# Patient Record
Sex: Male | Born: 1960 | Race: White | Hispanic: No | Marital: Married | Smoking: Never smoker
Health system: Southern US, Community
[De-identification: ages and names within clinical notes are randomized; demographics above are authoritative.]

## PROBLEM LIST (undated history)

## (undated) DIAGNOSIS — R972 Elevated prostate specific antigen [PSA]: Secondary | ICD-10-CM

## (undated) DIAGNOSIS — E0590 Thyrotoxicosis, unspecified without thyrotoxic crisis or storm: Secondary | ICD-10-CM

## (undated) DIAGNOSIS — E039 Hypothyroidism, unspecified: Secondary | ICD-10-CM

## (undated) HISTORY — PX: TONSILECTOMY, ADENOIDECTOMY, BILATERAL MYRINGOTOMY AND TUBES: SHX2538

## (undated) HISTORY — DX: Hypothyroidism, unspecified: E03.9

## (undated) HISTORY — DX: Thyrotoxicosis, unspecified without thyrotoxic crisis or storm: E05.90

## (undated) HISTORY — PX: OTHER SURGICAL HISTORY: SHX169

## (undated) HISTORY — DX: Elevated prostate specific antigen (PSA): R97.20

---

## 2014-10-29 ENCOUNTER — Other Ambulatory Visit: Payer: Self-pay | Admitting: Physician Assistant

## 2014-10-29 DIAGNOSIS — E0590 Thyrotoxicosis, unspecified without thyrotoxic crisis or storm: Secondary | ICD-10-CM

## 2014-11-05 ENCOUNTER — Ambulatory Visit
Admission: RE | Admit: 2014-11-05 | Discharge: 2014-11-05 | Disposition: A | Discharge status: Home / Self Care | Payer: BLUE CROSS/BLUE SHIELD | Source: Ambulatory Visit | Attending: Physician Assistant | Admitting: Physician Assistant

## 2014-11-05 DIAGNOSIS — E0590 Thyrotoxicosis, unspecified without thyrotoxic crisis or storm: Secondary | ICD-10-CM | POA: Insufficient documentation

## 2014-12-22 ENCOUNTER — Ambulatory Visit: Payer: BLUE CROSS/BLUE SHIELD | Admitting: *Deleted

## 2014-12-22 ENCOUNTER — Ambulatory Visit
Admission: RE | Admit: 2014-12-22 | Discharge: 2014-12-22 | Disposition: A | Discharge status: Home / Self Care | Payer: BLUE CROSS/BLUE SHIELD | Source: Ambulatory Visit | Attending: Unknown Physician Specialty | Admitting: Unknown Physician Specialty

## 2014-12-22 ENCOUNTER — Encounter: Payer: Self-pay | Admitting: Anesthesiology

## 2014-12-22 ENCOUNTER — Encounter
Admission: RE | Disposition: A | Discharge status: Home / Self Care | Payer: Self-pay | Source: Ambulatory Visit | Attending: Unknown Physician Specialty

## 2014-12-22 DIAGNOSIS — E0590 Thyrotoxicosis, unspecified without thyrotoxic crisis or storm: Secondary | ICD-10-CM | POA: Diagnosis not present

## 2014-12-22 DIAGNOSIS — Z1211 Encounter for screening for malignant neoplasm of colon: Secondary | ICD-10-CM | POA: Diagnosis not present

## 2014-12-22 DIAGNOSIS — K635 Polyp of colon: Secondary | ICD-10-CM | POA: Insufficient documentation

## 2014-12-22 DIAGNOSIS — K640 First degree hemorrhoids: Secondary | ICD-10-CM | POA: Insufficient documentation

## 2014-12-22 HISTORY — PX: COLONOSCOPY WITH PROPOFOL: SHX5780

## 2014-12-22 SURGERY — COLONOSCOPY WITH PROPOFOL
Anesthesia: General

## 2014-12-22 MED ORDER — LACTATED RINGERS IV SOLN
INTRAVENOUS | Status: DC | PRN
  Administered 2014-12-22: 13:00:00 via INTRAVENOUS

## 2014-12-22 MED ORDER — PHENYLEPHRINE HCL 10 MG/ML IJ SOLN
INTRAMUSCULAR | Status: DC | PRN
  Administered 2014-12-22: 100 ug via INTRAVENOUS

## 2014-12-22 MED ORDER — SODIUM CHLORIDE 0.9 % IV SOLN
INTRAVENOUS | Status: DC
  Administered 2014-12-22: 13:00:00 via INTRAVENOUS

## 2014-12-22 MED ORDER — PROPOFOL 500 MG/50ML IV EMUL
INTRAVENOUS | Status: DC | PRN
  Administered 2014-12-22: 100 ug/kg/min via INTRAVENOUS

## 2014-12-22 NOTE — Op Note (Signed)
[facility] Gastroenterology Patient Name: Patrick Benson Procedure Date: 12/22/2014 1:21 PM MRN: [id number] Account #: [id number] Date of Birth: Apr 10, 1960 Admit Type: Outpatient Age: 54 Room: [facility] ENDO ROOM 1 Gender: Male Note Status: Finalized Procedure:         Colonoscopy Indications:       Screening for colorectal malignant neoplasm Providers:         Scot Jun, MD Referring MD:      Jabier Mutton, MD (Referring MD) Medicines:         Propofol per Anesthesia Complications:     No immediate complications. Procedure:         Pre-Anesthesia Assessment:                    - After reviewing the risks and benefits, the patient was                     deemed in satisfactory condition to undergo the procedure.                    After obtaining informed consent, the colonoscope was                     passed under direct vision. Throughout the procedure, the                     patient's blood pressure, pulse, and oxygen saturations                     were monitored continuously. The Colonoscope was                     introduced through the anus and advanced to the the cecum,                     identified by appendiceal orifice and ileocecal valve. The                     colonoscopy was performed without difficulty. The patient                     tolerated the procedure well. The quality of the bowel                     preparation was good. Findings:      A small polyp was found in the proximal ascending colon. The polyp was       sessile. The polyp was removed with a hot snare. Resection and retrieval       were complete. One clip applied to the site.      Internal hemorrhoids were found during endoscopy. The hemorrhoids were       small and Grade I (internal hemorrhoids that do not prolapse).      The exam was otherwise without abnormality. Impression:        - One small polyp in the proximal ascending colon.                     Resected and  retrieved.                    - Internal hemorrhoids.                    - The examination was otherwise normal. Recommendation:    -  Await pathology results. Scot Junobert T Elliott, MD 12/22/2014 1:47:18 PM This report has been signed electronically. Number of Addenda: 0 Note Initiated On: 12/22/2014 1:21 PM Scope Withdrawal Time: 0 hours 9 minutes 45 seconds  Total Procedure Duration: 0 hours 17 minutes 27 seconds       [facility]

## 2014-12-22 NOTE — Anesthesia Preprocedure Evaluation (Signed)
Anesthesia Evaluation  Patient identified by MRN, date of birth, ID band Patient awake    Reviewed: Allergy & Precautions, H&P , NPO status , Patient's Chart, lab work & pertinent test results, reviewed documented beta blocker date and time   History of Anesthesia Complications Negative for: history of anesthetic complications  Airway Mallampati: III  TM Distance: >3 FB Neck ROM: full    Dental no notable dental hx. (+) Poor Dentition   Pulmonary neg pulmonary ROS,    Pulmonary exam normal breath sounds clear to auscultation       Cardiovascular Exercise Tolerance: Good negative cardio ROS Normal cardiovascular exam Rhythm:regular Rate:Normal     Neuro/Psych negative neurological ROS  negative psych ROS   GI/Hepatic negative GI ROS, Neg liver ROS,   Endo/Other  neg diabetesHyperthyroidism   Renal/GU negative Renal ROS  negative genitourinary   Musculoskeletal   Abdominal   Peds  Hematology negative hematology ROS (+)   Anesthesia Other Findings History reviewed. No pertinent past medical history.   Reproductive/Obstetrics negative OB ROS                             Anesthesia Physical Anesthesia Plan  ASA: II  Anesthesia Plan: General   Post-op Pain Management:    Induction:   Airway Management Planned:   Additional Equipment:   Intra-op Plan:   Post-operative Plan:   Informed Consent: I have reviewed the patients History and Physical, chart, labs and discussed the procedure including the risks, benefits and alternatives for the proposed anesthesia with the patient or authorized representative who has indicated his/her understanding and acceptance.   Dental Advisory Given  Plan Discussed with: Anesthesiologist, CRNA and Surgeon  Anesthesia Plan Comments:         Anesthesia Quick Evaluation

## 2014-12-22 NOTE — H&P (Signed)
   Primary Care Physician:  Fulton ReekSTRICKLAND, JAMES D, MD Primary Gastroenterologist:  Dr. Mechele CollinElliott  Pre-Procedure History & Physical: HPI:  Patrick SimsDavid Benson is a 54 y.o. male is here for an colonoscopy.   No past medical history on file.  No past surgical history on file.  Prior to Admission medications   Not on File    Allergies as of 11/25/2014  . (Not on File)    No family history on file.  Social History   Social History  . Marital Status: Married    Spouse Name: N/A  . Number of Children: N/A  . Years of Education: N/A   Occupational History  . Not on file.   Social History Main Topics  . Smoking status: Not on file  . Smokeless tobacco: Not on file  . Alcohol Use: Not on file  . Drug Use: Not on file  . Sexual Activity: Not on file   Other Topics Concern  . Not on file   Social History Narrative  . No narrative on file    Review of Systems: See HPI, otherwise negative ROS  Physical Exam: BP 125/74 mmHg  Pulse 74  Temp(Src) 97.2 F (36.2 C) (Tympanic)  Resp 18  Ht 5\' 4"  (1.626 m)  Wt 63.504 kg (140 lb)  BMI 24.02 kg/m2 General:   Alert,  pleasant and cooperative in NAD Head:  Normocephalic and atraumatic. Neck:  Supple; no masses or thyromegaly. Lungs:  Clear throughout to auscultation.    Heart:  Regular rate and rhythm. Abdomen:  Soft, nontender and nondistended. Normal bowel sounds, without guarding, and without rebound.   Neurologic:  Alert and  oriented x4;  grossly normal neurologically.  Impression/Plan: Patrick Benson is here for an colonoscopy to be performed for screening colon  Risks, benefits, limitations, and alternatives regarding  colonoscopy have been reviewed with the patient.  Questions have been answered.  All parties agreeable.   Lynnae PrudeELLIOTT, ROBERT, MD  12/22/2014, 1:11 PM

## 2014-12-22 NOTE — Transfer of Care (Signed)
Immediate Anesthesia Transfer of Care Note  Patient: Patrick SimsDavid Benson  Procedure(s) Performed: Procedure(s): COLONOSCOPY WITH PROPOFOL (N/A)  Patient Location: Endoscopy Unit  Anesthesia Type:General  Level of Consciousness: awake  Airway & Oxygen Therapy: Patient Spontanous Breathing and Patient connected to nasal cannula oxygen  Post-op Assessment: Report given to RN and Post -op Vital signs reviewed and stable  Post vital signs: Reviewed and stable  Last Vitals:  Filed Vitals:   12/22/14 1410  BP: 103/70  Pulse: 67  Temp:   Resp: 19    Complications: No apparent anesthesia complications

## 2014-12-23 NOTE — Anesthesia Postprocedure Evaluation (Signed)
  Anesthesia Post-op Note  Patient: Patrick SimsDavid Doenges  Procedure(s) Performed: Procedure(s): COLONOSCOPY WITH PROPOFOL (N/A)  Anesthesia type:General  Patient location: PACU  Post pain: Pain level controlled  Post assessment: Post-op Vital signs reviewed, Patient's Cardiovascular Status Stable, Respiratory Function Stable, Patent Airway and No signs of Nausea or vomiting  Post vital signs: Reviewed and stable  Last Vitals:  Filed Vitals:   12/22/14 1420  BP: 101/75  Pulse: 60  Temp:   Resp: 21    Level of consciousness: awake, alert  and patient cooperative  Complications: No apparent anesthesia complications

## 2014-12-25 ENCOUNTER — Encounter: Payer: Self-pay | Admitting: Unknown Physician Specialty

## 2014-12-25 LAB — SURGICAL PATHOLOGY

## 2015-09-21 ENCOUNTER — Ambulatory Visit (INDEPENDENT_AMBULATORY_CARE_PROVIDER_SITE_OTHER): Payer: BLUE CROSS/BLUE SHIELD | Admitting: Urology

## 2015-09-21 ENCOUNTER — Encounter: Payer: Self-pay | Admitting: Urology

## 2015-09-21 DIAGNOSIS — R972 Elevated prostate specific antigen [PSA]: Secondary | ICD-10-CM

## 2015-09-21 MED ORDER — SULFAMETHOXAZOLE-TRIMETHOPRIM 800-160 MG PO TABS: 1.0000 | ORAL_TABLET | Freq: Two times a day (BID) | ORAL | 0 refills | Status: AC

## 2015-09-21 NOTE — Progress Notes (Signed)
   09/21/2015 9:33 AM   Patrick Benson 11/08/1960 [id number]  Referring provider: Jabier Mutton, MD [facility] [address] [place], [place] [address]  No chief complaint on file.   HPI:  1 - Elevated / Rising PSA - No FHX prostate cancer. Rising PSA astutely noted by PCP. 2016 PSA 1.6 05/2015 PSA 3.9 --> recheck 08/2015 PSA 7.2 with 7% free (>50% chance cancer) / DRE 25 gm smooth.  PMH sig for TNA as child. NO CV disease. NO blood thinner. He is Sport and exercise psychologist.   Today "Patrick Benson" is seen as new patient for above. He is referred by Ethlyn Gallery MD with [facility] occupational health.   PMH: No past medical history on file.  Surgical History: Past Surgical History:  Procedure Laterality Date  . COLONOSCOPY WITH PROPOFOL N/A 12/22/2014   Procedure: COLONOSCOPY WITH PROPOFOL;  Surgeon: Scot Jun, MD;  Location: [facility] ENDOSCOPY;  Service: Endoscopy;  Laterality: N/A;    Home Medications:    Medication List    as of 09/21/2015  9:33 AM   You have not been prescribed any medications.     Allergies: No Known Allergies  Family History: No family history on file.  Social History:  has no tobacco, alcohol, and drug history on file.  ROS:     Gastrointestinal (upper)  : Negative for upper GI symptoms  Gastrointestinal (lower) : Negative for lower GI symptoms  Constitutional : Negative for symptoms  Skin: Negative for skin symptoms  Eyes: Negative for eye symptoms  Ear/Nose/Throat : Negative for Ear/Nose/Throat symptoms  Hematologic/Lymphatic: Negative for Hematologic/Lymphatic symptoms  Cardiovascular : Negative for cardiovascular symptoms  Respiratory : Negative for respiratory symptoms  Endocrine: Negative for endocrine symptoms  Musculoskeletal: Negative for musculoskeletal symptoms  Neurological: Negative for neurological symptoms  Psychologic: Negative for psychiatric symptoms     Physical  Exam: There were no vitals taken for this visit.  Constitutional:  Alert and oriented, No acute distress. HEENT: [place] AT, moist mucus membranes.  Trachea midline, no masses. Cardiovascular: No clubbing, cyanosis, or edema. Respiratory: Normal respiratory effort, no increased work of breathing. GI: Abdomen is soft, nontender, nondistended, no abdominal masses. Non-obese.  GU: No CVA tenderness. DRE 25gm smooth, mobile.  Skin: No rashes, bruises or suspicious lesions. Lymph: No cervical or inguinal adenopathy. Neurologic: Grossly intact, no focal deficits, moving all 4 extremities. Psychiatric: Normal mood and affect.  Laboratory Data: No results found for: WBC, HGB, HCT, MCV, PLT  No results found for: CREATININE  No results found for: PSA  No results found for: TESTOSTERONE  No results found for: HGBA1C  Urinalysis No results found for: COLORURINE, APPEARANCEUR, LABSPEC, PHURINE, GLUCOSEU, HGBUR, BILIRUBINUR, KETONESUR, PROTEINUR, UROBILINOGEN, NITRITE, LEUKOCYTESUR   Assessment & Plan:    1. Elevated PSA   - significant rise and elevation in younger man with minimal comorbidity. Strongly rec biopsy. Risks, benefits, alternatives, peri-BX course discussed.     No Follow-up on file.  Sebastian Ache, MD  [facility] [address], [address] [place], [place] [address] [phone]

## 2015-11-16 ENCOUNTER — Encounter: Payer: Self-pay | Admitting: Urology

## 2015-11-16 ENCOUNTER — Ambulatory Visit (INDEPENDENT_AMBULATORY_CARE_PROVIDER_SITE_OTHER): Payer: BLUE CROSS/BLUE SHIELD | Admitting: Urology

## 2015-11-16 ENCOUNTER — Other Ambulatory Visit: Payer: Self-pay | Admitting: Urology

## 2015-11-16 VITALS — BP 130/83 | HR 60 | Ht 63.0 in | Wt 152.8 lb

## 2015-11-16 DIAGNOSIS — R972 Elevated prostate specific antigen [PSA]: Secondary | ICD-10-CM

## 2015-11-16 MED ORDER — GENTAMICIN SULFATE 40 MG/ML IJ SOLN
80.0000 mg | Freq: Once | INTRAMUSCULAR | Status: AC
  Administered 2015-11-16: 80 mg via INTRAMUSCULAR

## 2015-11-16 MED ORDER — LIDOCAINE HCL 2 % EX GEL
1.0000 "application " | Freq: Once | CUTANEOUS | Status: AC
  Administered 2015-11-16: 1 via URETHRAL

## 2015-11-16 NOTE — Progress Notes (Signed)
09/21/2015 9:33 AM   Patrick Benson 01/21/1961 [id number]  Referring provider: Jabier MuttonJames Strickland, MD [facility] [address] [place], [place] [address]  No chief complaint on file.   HPI:  1 - Elevated / Rising PSA - No FHX prostate cancer. Rising PSA astutely noted by PCP. 2016 PSA 1.6 05/2015 PSA 3.9 --> recheck 08/2015 PSA 7.2 with 7% free (>50% chance cancer) / DRE 25 gm smooth. 10/2015 TRUS BX 50.4 mL / no median lobe.   PMH sig for TNA as child. NO CV disease. NO blood thinner. He is Sport and exercise psychologistcivil engineer. His PCP is Ethlyn GalleryJoe Rabinowitz MD with [facility] [place] [facility].  Today "[name]" is seen to proceed with prostate biopsy. He took ABX and enema as RX'd.   PMH: No past medical history on file.  Surgical History: Past Surgical History:  Procedure Laterality Date  . COLONOSCOPY WITH PROPOFOL N/A 12/22/2014   Procedure: COLONOSCOPY WITH PROPOFOL;  Surgeon: Scot Junobert T Elliott, MD;  [facility];  Service: Endoscopy;  Laterality: N/A;    Home Medications:    Medication List    as of 09/21/2015  9:33 AM   You have not been prescribed any medications.     Allergies: No Known Allergies  Family History: No family history on file.  Social History:  has no tobacco, alcohol, and drug history on file.  ROS:     Gastrointestinal (upper)  : Negative for upper GI symptoms  Gastrointestinal (lower) : Negative for lower GI symptoms  Constitutional : Negative for symptoms  Skin: Negative for skin symptoms  Eyes: Negative for eye symptoms  Ear/Nose/Throat : Negative for Ear/Nose/Throat symptoms  Hematologic/Lymphatic: Negative for Hematologic/Lymphatic symptoms  Cardiovascular : Negative for cardiovascular symptoms  Respiratory : Negative for respiratory symptoms  Endocrine: Negative for endocrine symptoms  Musculoskeletal: Negative for musculoskeletal symptoms  Neurological: Negative for neurological  symptoms  Psychologic: Negative for psychiatric symptoms     Physical Exam: There were no vitals taken for this visit.  Constitutional:  Alert and oriented, No acute distress. HEENT: [place] AT, moist mucus membranes.  Trachea midline, no masses. Cardiovascular: No clubbing, cyanosis, or edema. Respiratory: Normal respiratory effort, no increased work of breathing. GI: Abdomen is soft, nontender, nondistended, no abdominal masses. Non-obese.  GU: No CVA tenderness. DRE 25gm smooth, mobile.  Skin: No rashes, bruises or suspicious lesions. Lymph: No cervical or inguinal adenopathy. Neurologic: Grossly intact, no focal deficits, moving all 4 extremities. Psychiatric: Normal mood and affect.  Laboratory Data: No results found for: WBC, HGB, HCT, MCV, PLT  No results found for: CREATININE  No results found for: PSA  No results found for: TESTOSTERONE  No results found for: HGBA1C  Urinalysis No results found for: COLORURINE, APPEARANCEUR, LABSPEC, PHURINE, GLUCOSEU, HGBUR, BILIRUBINUR, KETONESUR, PROTEINUR, UROBILINOGEN, NITRITE, LEUKOCYTESUR  Prostate Biopsy Procedure   Informed consent was obtained after discussing risks/benefits of the procedure.  A time out was performed to ensure correct patient identity.  Pre-Procedure: - Gentamicin given prophylactically - Levaquin 500 mg administered PO -Transrectal Ultrasound performed revealing a 50.4 gm prostate -No significant hypoechoic or median lobe noted  Procedure: - Prostate block performed using 10 cc 1% lidocaine and biopsies taken from sextant areas, a total of 12 under ultrasound guidance.  Post-Procedure: - Patient tolerated the procedure well - He was counseled to seek immediate medical attention if experiences any severe pain, significant bleeding, or fevers - Return in one week to discuss biopsy results    Assessment &  Plan:    1. Elevated PSA   - RTC 3 weeks to discuss results. Strongly warned to contact MD for  new fevers or urinary retention post-BX.      No Follow-up on file.  Sebastian Ache, MD  [facility] [address] [place], [place] [address] [phone]

## 2015-11-19 ENCOUNTER — Other Ambulatory Visit: Payer: Self-pay | Admitting: Urology

## 2015-11-19 LAB — PATHOLOGY REPORT

## 2015-11-24 ENCOUNTER — Encounter: Payer: Self-pay | Admitting: Urology

## 2015-11-24 ENCOUNTER — Ambulatory Visit (INDEPENDENT_AMBULATORY_CARE_PROVIDER_SITE_OTHER): Payer: BLUE CROSS/BLUE SHIELD | Admitting: Urology

## 2015-11-24 VITALS — BP 115/71 | HR 56 | Ht 63.0 in | Wt 151.1 lb

## 2015-11-24 DIAGNOSIS — R972 Elevated prostate specific antigen [PSA]: Secondary | ICD-10-CM | POA: Diagnosis not present

## 2015-11-24 NOTE — Progress Notes (Signed)
   09/21/2015 9:33 AM   Patrick Benson 02/10/1961 [id number]  Referring provider: Jabier MuttonJames Strickland, MD [facility]  [address] [place], [place] [address]  No chief complaint on file.   HPI:  1 - Elevated / Rising PSA - No FHX prostate cancer. Rising PSA astutely noted by PCP. 2016 PSA 1.6 05/2015 PSA 3.9 --> recheck 08/2015 PSA 7.2 with 7% free (>50% chance cancer) / DRE 25 gm smooth. 10/2015 NEGATIVE TRUS BX 50.4 mL / no median lobe.   PMH sig for TNA as child. NO CV disease. NO blood thinner. 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  Today "Patrick Benson" is seen in f/u above. He is back to baseline.   PMH: No past medical history on file.  Surgical History: Past Surgical History:  Procedure Laterality Date  . COLONOSCOPY WITH PROPOFOL N/A 12/22/2014   Procedure: COLONOSCOPY WITH PROPOFOL;  Surgeon: Patrick Junobert T Elliott, MD;  [facility];  Service: Endoscopy;  Laterality: N/A;    Home Medications:    Medication List    as of 09/21/2015  9:33 AM   You have not been prescribed any medications.     Allergies: No Known Allergies  Family History: No family history on file.  Social History:  has no tobacco, alcohol, and drug history on file.  ROS:     Gastrointestinal (upper)  : Negative for upper GI symptoms  Gastrointestinal (lower) : Negative for lower GI symptoms  Constitutional : Negative for symptoms  Skin: Negative for skin symptoms  Eyes: Negative for eye symptoms  Ear/Nose/Throat : Negative for Ear/Nose/Throat symptoms  Hematologic/Lymphatic: Negative for Hematologic/Lymphatic symptoms  Cardiovascular : Negative for cardiovascular symptoms  Respiratory : Negative for respiratory symptoms  Endocrine: Negative for endocrine symptoms  Musculoskeletal: Negative for musculoskeletal symptoms  Neurological: Negative for neurological  symptoms  Psychologic: Negative for psychiatric symptoms     Physical Exam: There were no vitals taken for this visit.  Constitutional:  Alert and oriented, No acute distress. HEENT: [place] AT, moist mucus membranes.  Trachea midline, no masses. Cardiovascular: No clubbing, cyanosis, or edema. Respiratory: Normal respiratory effort, no increased work of breathing. GI: Abdomen is soft, nontender, nondistended, no abdominal masses. Non-obese.  GU: No CVA tenderness. DRE 25gm smooth, mobile.  Skin: No rashes, bruises or suspicious lesions. Lymph: No cervical or inguinal adenopathy. Neurologic: Grossly intact, no focal deficits, moving all 4 extremities. Psychiatric: Normal mood and affect.  Laboratory Data: No results found for: WBC, HGB, HCT, MCV, PLT  No results found for: CREATININE  No results found for: PSA  No results found for: TESTOSTERONE  No results found for: HGBA1C  Urinalysis No results found for: COLORURINE, APPEARANCEUR, LABSPEC, PHURINE, GLUCOSEU, HGBUR, BILIRUBINUR, KETONESUR, PROTEINUR, UROBILINOGEN, NITRITE, LEUKOCYTESUR  Assessment & Plan:    1. Elevated PSA   - histologically proven BPH. This is great news. Rec annual surveillance with us x few years as PSA is quite high at young age. Consider repeat BX with MRI prior (to rule out anterior lesions) if PSA trend continues up.   2 - RTC 1 year with PSA,      No Follow-up on file.  Patrick Benson, Patrick Davitt, MD  [facility] [address], [address] [place], [place] [address] [phone]

## 2016-11-23 ENCOUNTER — Other Ambulatory Visit: Payer: BLUE CROSS/BLUE SHIELD

## 2016-11-28 ENCOUNTER — Ambulatory Visit: Payer: BLUE CROSS/BLUE SHIELD

## 2017-09-04 IMAGING — US US SOFT TISSUE HEAD/NECK
1 series · 14 of 25 positions shown · non-contrast
Comparison: None.

CLINICAL DATA: Hyperthyroidism

EXAM:
THYROID ULTRASOUND
TECHNIQUE: Ultrasound examination of the thyroid gland and adjacent soft
tissues was performed.

[Series 1: us soft tissue head/neck · 0.09mm/px · 14 of 57 slices shown]
[im 1/57]
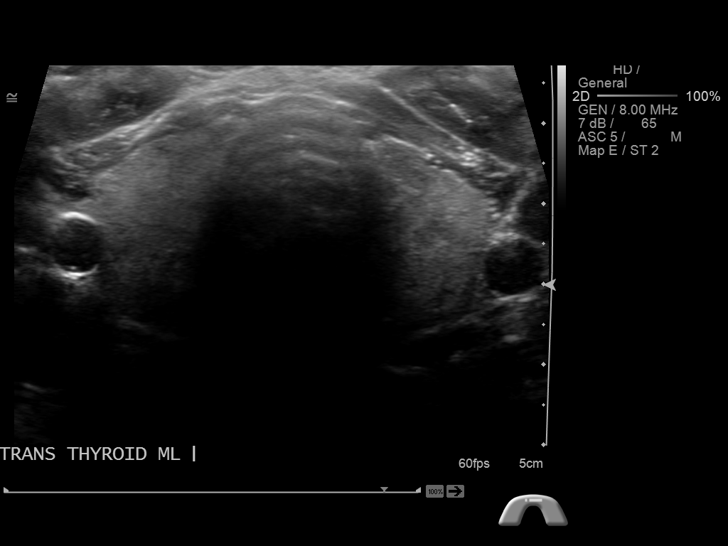
[im 5/57]
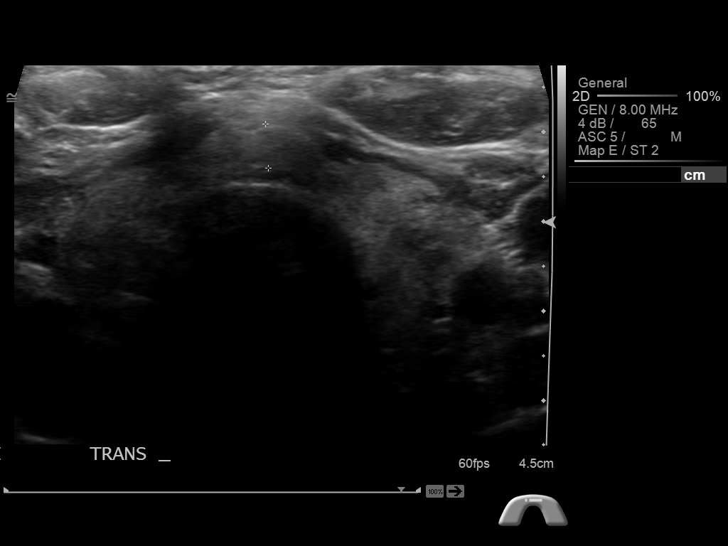
[im 10/57]
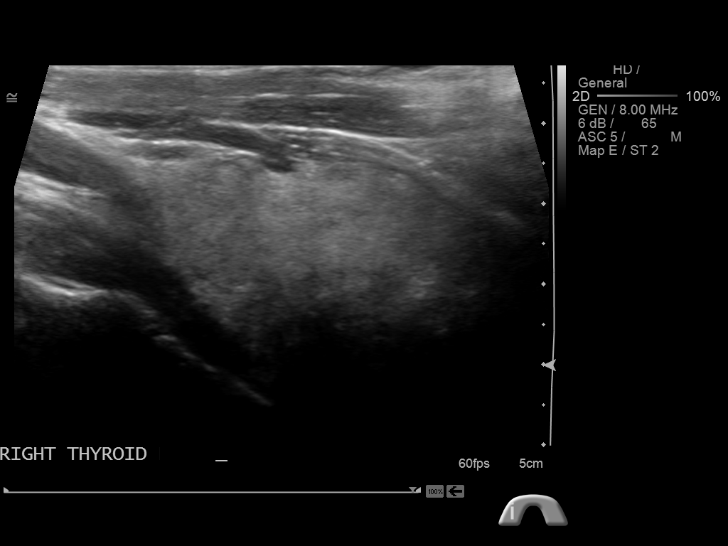
[im 15/57]
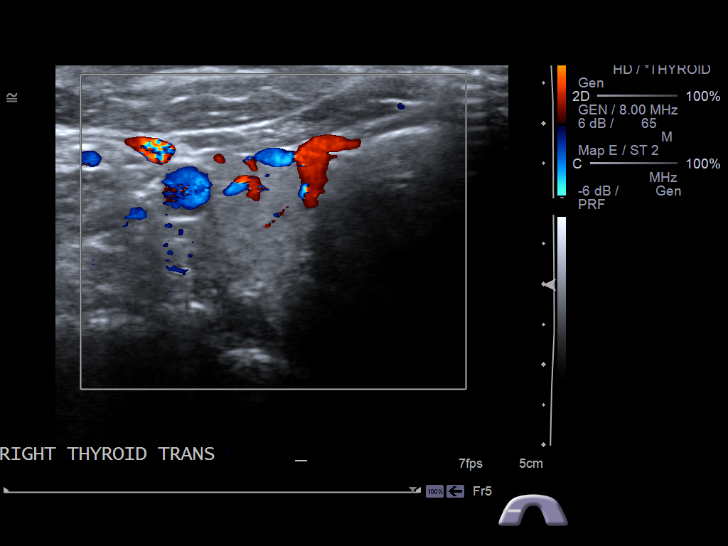
[im 19/57]
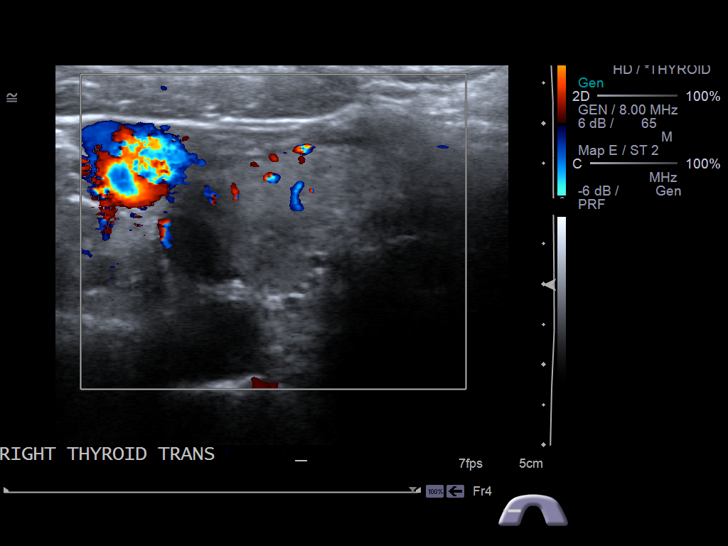
[im 22/57]
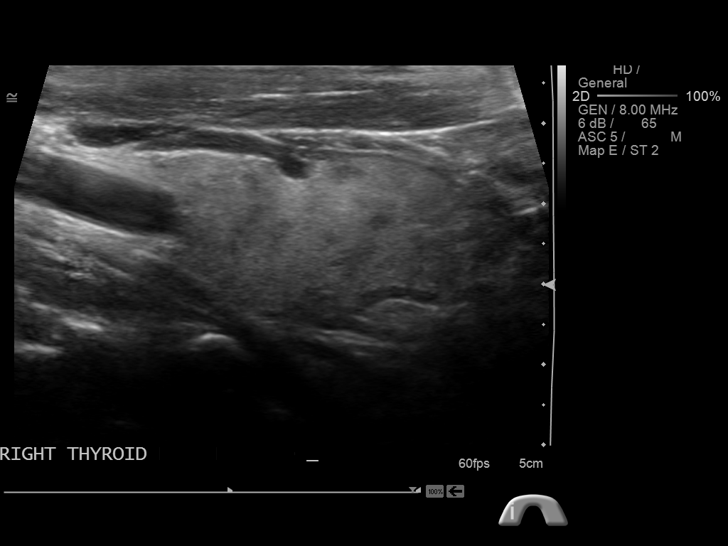
[im 26/57]
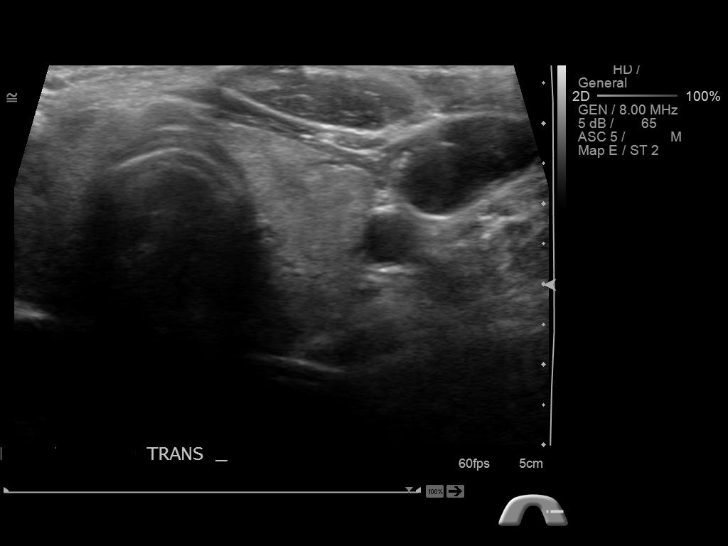
[im 31/57]
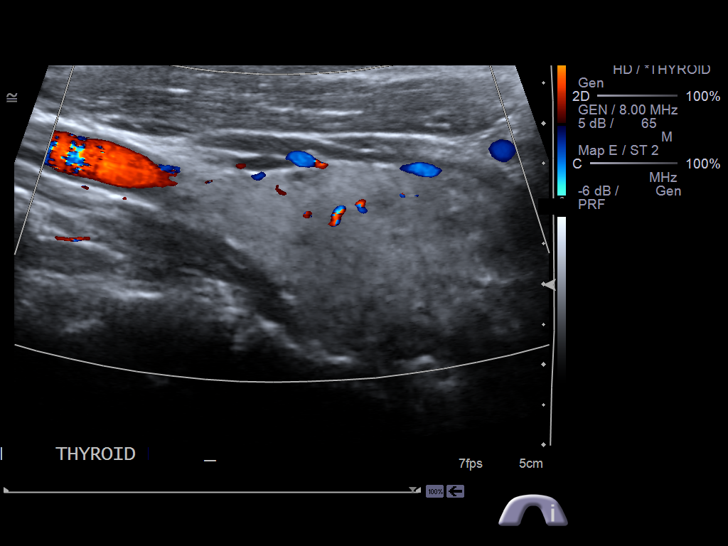
[im 36/57]
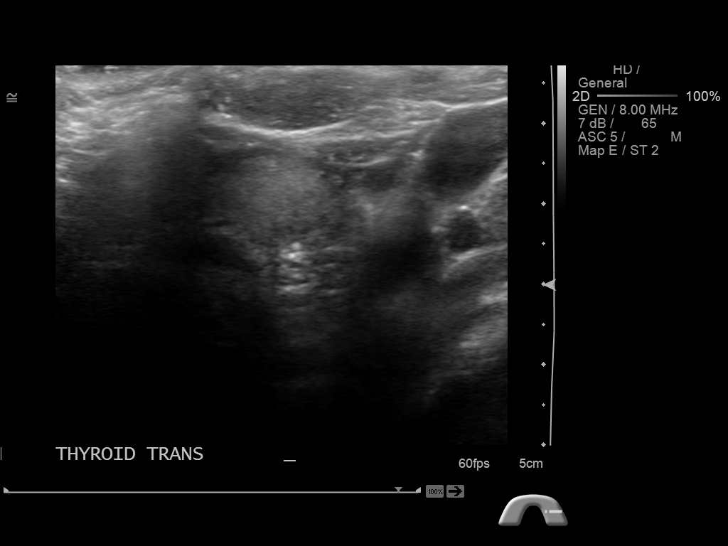
[im 38/57]
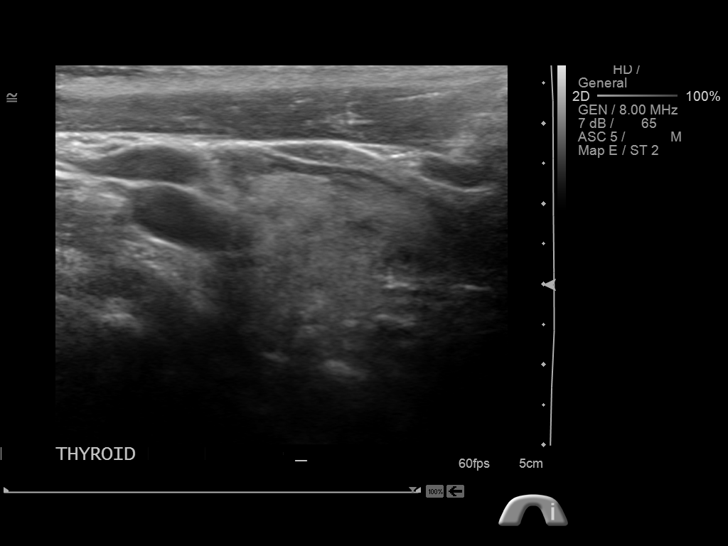
[im 43/57]
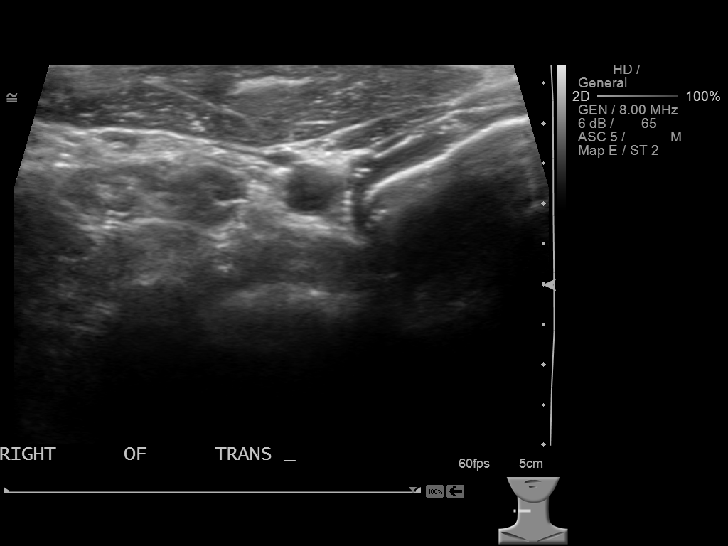
[im 47/57]
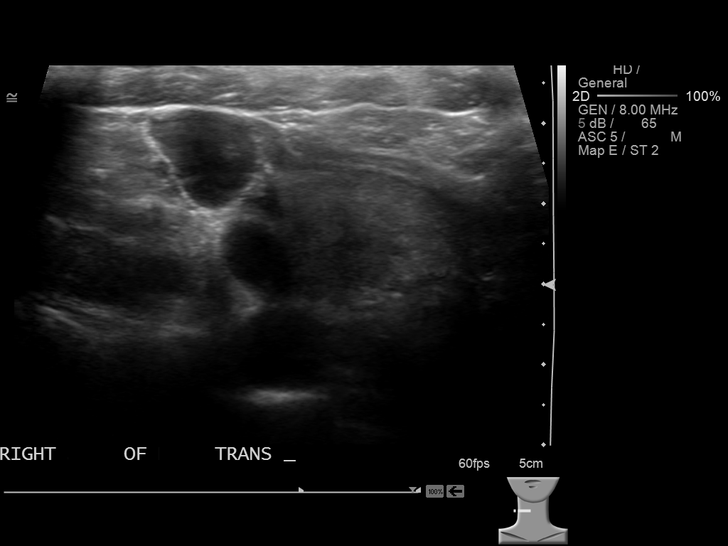
[im 52/57]
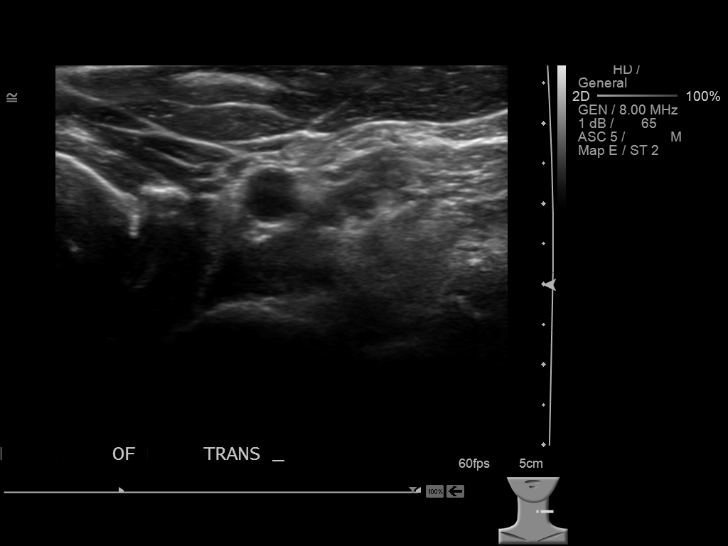
[im 57/57]
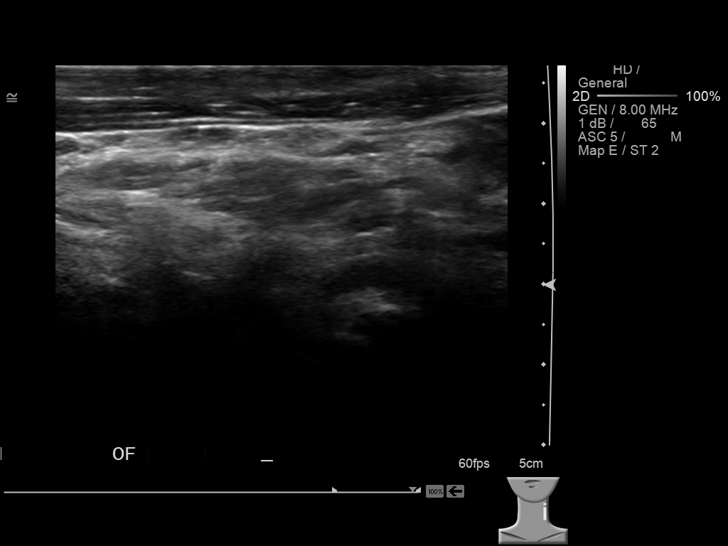

[14 of 25 positions shown; findings below may reference images not displayed]

FINDINGS: Right thyroid lobe

Measurements: 4.6 x 1.8 x 1.7 cm. Heterogeneous without focal
nodule.

Left thyroid lobe

Measurements: 4.5 x 2.5 x 1.6 cm. Heterogeneous without focal
nodule.

Isthmus

Thickness: 5 mm.  No nodules visualized.

Lymphadenopathy

None visualized.
IMPRESSION: Heterogeneous gland without focal nodule. Within normal limits in
size.

## 2018-11-15 ENCOUNTER — Ambulatory Visit: Payer: 59 | Admitting: Internal Medicine

## 2018-11-15 ENCOUNTER — Encounter: Payer: Self-pay | Admitting: Internal Medicine

## 2018-11-15 ENCOUNTER — Other Ambulatory Visit: Payer: Self-pay

## 2018-11-15 VITALS — BP 119/77 | HR 76 | Temp 97.7°F | Resp 12 | Ht 63.0 in | Wt 161.0 lb

## 2018-11-15 DIAGNOSIS — W57XXXA Bitten or stung by nonvenomous insect and other nonvenomous arthropods, initial encounter: Secondary | ICD-10-CM

## 2018-11-15 DIAGNOSIS — L309 Dermatitis, unspecified: Secondary | ICD-10-CM | POA: Insufficient documentation

## 2018-11-15 DIAGNOSIS — S30861A Insect bite (nonvenomous) of abdominal wall, initial encounter: Secondary | ICD-10-CM

## 2018-11-15 NOTE — Progress Notes (Signed)
Sunday cleaning a pool & killed a black widow spider.  Monday noticed there was an area on his abdomen that looked like a big mosquito bite.  Tuesday noticed bruising around the perimeter of the bite. Pinkish nickel sized area right above the belt. Washed with soap & water.  No topical antibiotic ointments used. The area itches.  Has not used any antihistamines (orally or topically).  States someone told him he needs to come have doctor look at it because if it was a black widow, it could get worse.  AMD

## 2018-11-15 NOTE — Progress Notes (Signed)
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  He was cleaning and emptying the water, and the next day, he noted a red welt type area on his right lower abdomen.  He over the next day noted a small area beneath the red welt that was similar, and some bruising that was starting to form around the welt area.  It was mildly itchy more than painful.  A few small red spots have subsequently developed along the belt line below these 2 areas, that he was concerned may have been a bite from a spider, possibly a black widow spider.  He noted in the past 24 to 48 hours really has not changed much, he has not applied anything topically to the area.  He just wanted to come and get checked as he was concerned it may be could worsen over time. He denies any major muscle aches, no fevers, not feeling ill.  No COVID concerning symptoms. He has had poison ivy on numerous occasions in his past, and notes he was not outside with his shirt off at all working, and does not think he had an exposure.  No Known Allergies No current outpatient medications on file prior to visit.   No current facility-administered medications on file prior to visit.    Never smoker  O - NAD, masked  BP 119/77 (BP Location: Right Arm, Patient Position: Sitting, Cuff Size: Normal)   Pulse 76   Temp 97.7 F (36.5 C)   Resp 12   Ht 5\' 3"  (1.6 m)   Wt 161 lb (73 kg)   SpO2 96%   BMI 28.52 kg/m   HEENT  -positive glasses, sclera were anicteric Abd -in the right lower quadrant was a small circular erythematous slightly raised lesion with no central puncture site and about dime sized.  There was some fading mild ecchymosis immediately surrounding this area with no induration and not markedly tender to palpate.  Immediately beneath this lesion was a much smaller circular slightly raised  erythematous area with no bruising surrounding, and no induration.  There was also no central puncture site to this lesion.  Immediately beneath this lesion were 5 very small erythematous satellite lesions, with these along the belt line There were no other lesions evident on the abdomen, nor elsewhere on exposed areas and he denied other areas of involvement as well. His affect was not flat, he was very appropriate with conversation  Ass/Plan -possible bite from an insect or spider noted on his abdomen, with a superficial reaction to this likely. No induration noted. With him noting it is slightly itchy, noted it has an appearance beneath the 2 lesions like a contact dermatitis, possibly poison ivy with his history.  That seemed much less likely to him.  Has not progressed in the last 24 to 48 hours, and he has had no muscle aches which are usually the most presenting symptom of a black widow spider bite.  He has had no fevers, does not feel ill.  Educated on the above, and the slight bruising surrounding the area is possibly due to its location and pressure to this area from the belt line, especially with bending activities.  Reassured there is no bruising elsewhere.  Can apply a topical hydrocortisone product, over-the-counter strength as needed if it is more itchy, and keep the area  clean emphasized.  Not to scratch at as he has been trying not to. Continue to closely monitor, and if it is progressing, if he is developing other symptoms of concern such as fevers, feeling ill, more achy, bruising occurring elsewhere, should follow-up again with a provider to be reassessed.  He was understanding of the recommendations, and await his response.

## 2019-06-21 NOTE — Progress Notes (Signed)
Scheduled to complete physical 07/01/19.  (Provider TBD)  AMD 

## 2019-06-24 ENCOUNTER — Other Ambulatory Visit: Payer: Self-pay

## 2019-06-24 ENCOUNTER — Ambulatory Visit: Payer: Self-pay

## 2019-06-24 DIAGNOSIS — Z0000 Encounter for general adult medical examination without abnormal findings: Secondary | ICD-10-CM

## 2019-06-24 LAB — POCT URINALYSIS DIPSTICK
Bilirubin, UA: NEGATIVE
Blood, UA: NEGATIVE
Glucose, UA: NEGATIVE
Ketones, UA: NEGATIVE
Leukocytes, UA: NEGATIVE
Nitrite, UA: NEGATIVE
Protein, UA: NEGATIVE
Spec Grav, UA: >=1.03 — AB (ref 1.010–1.025)
Urobilinogen, UA: 0.2 E.U./dL
pH, UA: 5.5 (ref 5.0–8.0)

## 2019-06-25 LAB — CMP12+LP+TP+TSH+6AC+PSA+CBC…
ALT: 28 IU/L (ref 0–44)
Albumin: 4.3 g/dL (ref 3.8–4.9)
Alkaline Phosphatase: 88 IU/L (ref 39–117)
Basos: 1 %
Calcium: 9.4 mg/dL (ref 8.7–10.2)
Chloride: 105 mmol/L (ref 96–106)
Chol/HDL Ratio: 4.1 ratio (ref 0.0–5.0)
Cholesterol, Total: 211 mg/dL — ABNORMAL HIGH (ref 100–199)
Eos: 5 %
Estimated CHD Risk: 0.8 times avg. (ref 0.0–1.0)
Free Thyroxine Index: 2.3 (ref 1.2–4.9)
GFR calc Af Amer: 100 mL/min/{1.73_m2} (ref >59)
GGT: 34 IU/L (ref 0–65)
HDL: 51 mg/dL (ref >39)
Hemoglobin: 16.3 g/dL (ref 13.0–17.7)
Immature Grans (Abs): 0 10*3/uL (ref 0.0–0.1)
Iron: 57 ug/dL (ref 38–169)
Lymphocytes Absolute: 2.8 10*3/uL (ref 0.7–3.1)
Lymphs: 33 %
MCHC: 35.2 g/dL (ref 31.5–35.7)
Monocytes: 8 %
Neutrophils Absolute: 4.5 10*3/uL (ref 1.4–7.0)
Neutrophils: 53 %
Phosphorus: 3.9 mg/dL (ref 2.8–4.1)
Potassium: 4.2 mmol/L (ref 3.5–5.2)
Prostate Specific Ag, Serum: 1.7 ng/mL (ref 0.0–4.0)
RDW: 12.7 % (ref 11.6–15.4)
T3 Uptake Ratio: 26 % (ref 24–39)
T4, Total: 8.7 ug/dL (ref 4.5–12.0)
TSH: 1.17 u[IU]/mL (ref 0.450–4.500)
VLDL Cholesterol Cal: 18 mg/dL (ref 5–40)
WBC: 8.3 10*3/uL (ref 3.4–10.8)

## 2019-06-25 LAB — CMP12+LP+TP+TSH+6AC+PSA+CBC?
AST: 24 IU/L (ref 0–40)
Albumin/Globulin Ratio: 1.5 (ref 1.2–2.2)
BUN/Creatinine Ratio: 11 (ref 9–20)
BUN: 11 mg/dL (ref 6–24)
Basophils Absolute: 0.1 10*3/uL (ref 0.0–0.2)
Bilirubin Total: 0.3 mg/dL (ref 0.0–1.2)
Creatinine, Ser: 0.96 mg/dL (ref 0.76–1.27)
EOS (ABSOLUTE): 0.4 10*3/uL (ref 0.0–0.4)
GFR calc non Af Amer: 86 mL/min/{1.73_m2} (ref >59)
Globulin, Total: 2.9 g/dL (ref 1.5–4.5)
Glucose: 96 mg/dL (ref 65–99)
Hematocrit: 46.3 % (ref 37.5–51.0)
Immature Granulocytes: 0 %
LDH: 178 IU/L (ref 121–224)
LDL Chol Calc (NIH): 142 mg/dL — ABNORMAL HIGH (ref 0–99)
MCH: 30.9 pg (ref 26.6–33.0)
MCV: 88 fL (ref 79–97)
Monocytes Absolute: 0.6 10*3/uL (ref 0.1–0.9)
Platelets: 302 10*3/uL (ref 150–450)
RBC: 5.27 x10E6/uL (ref 4.14–5.80)
Sodium: 141 mmol/L (ref 134–144)
Total Protein: 7.2 g/dL (ref 6.0–8.5)
Triglycerides: 98 mg/dL (ref 0–149)
Uric Acid: 4.9 mg/dL (ref 3.8–8.4)

## 2019-07-03 ENCOUNTER — Encounter: Payer: Self-pay | Admitting: Emergency Medicine

## 2019-07-03 ENCOUNTER — Ambulatory Visit: Payer: 59 | Admitting: Emergency Medicine

## 2019-07-03 ENCOUNTER — Other Ambulatory Visit: Payer: Self-pay

## 2019-07-03 VITALS — BP 120/70 | HR 62 | Temp 98.1°F | Resp 12 | Ht 63.0 in | Wt 158.0 lb

## 2019-07-03 DIAGNOSIS — Z0000 Encounter for general adult medical examination without abnormal findings: Secondary | ICD-10-CM

## 2019-07-03 DIAGNOSIS — L821 Other seborrheic keratosis: Secondary | ICD-10-CM

## 2019-07-03 NOTE — Progress Notes (Signed)
Uses Nasacort prn when works outside due to pollen.

## 2019-07-03 NOTE — Progress Notes (Signed)
I have reviewed the triage vital signs and the nursing notes.   HISTORY  Chief Complaint Annual Exam   HPI Patrick Benson is a 59 y.o. male is here for an annual physical.  Patient states that he has been getting dental implants on the left and broke a bridge off on the right.  He currently is not eating as he normally does as he is on a soft diet.  Covid also postponed some of his dental work which has put him behind schedule.  Patient otherwise has no complaints.       Past Medical History:  Diagnosis Date  . Elevated PSA   . Hyperthyroidism   . Hypothyroidism     Patient Active Problem List   Diagnosis Date Noted  . Insect bite (nonvenomous) of abdominal wall, initial encounter 11/15/2018  . Dermatitis 11/15/2018  . Elevated PSA 09/21/2015    Past Surgical History:  Procedure Laterality Date  . broken nose    . COLONOSCOPY WITH PROPOFOL N/A 12/22/2014   Procedure: COLONOSCOPY WITH PROPOFOL;  Surgeon: Manya Silvas, MD;  Location: [facility] ENDOSCOPY;  Service: Endoscopy;  Laterality: N/A;  . TONSILECTOMY, ADENOIDECTOMY, BILATERAL MYRINGOTOMY AND TUBES      Prior to Admission medications   Not on File    Allergies Patient has no known allergies.  Family History  Problem Relation Age of Onset  . Hematuria Neg Hx   . Prostate cancer Neg Hx   . Sickle cell anemia Neg Hx     Social History Social History   Tobacco Use  . Smoking status: Never Smoker  . Smokeless tobacco: Current User    Types: Snuff  Substance Use Topics  . Alcohol use: No  . Drug use: No    Review of Systems Constitutional: No fever/chills Eyes: wears glasses ENT: In process of getting dental implants on the left side and broke bridge of on the right side. Cardiovascular: Denies chest pain. Respiratory: Denies shortness of breath. Gastrointestinal: No abdominal pain.   Musculoskeletal: Negative for muscle skeletal pain. Skin: Negative for rash. Neurological: Negative for  headaches, focal weakness or numbness. ____________________________________________   PHYSICAL EXAM: Constitutional: Alert and oriented. Well appearing and in no acute distress. Eyes: Conjunctivae are normal. PERRL. EOMI. Head: Atraumatic. Neck: No stridor.  No cervical tenderness on palpation posteriorly. Cardiovascular: Normal rate, regular rhythm. Grossly normal heart sounds.  Good peripheral circulation. Respiratory: Normal respiratory effort.  No retractions. Lungs CTAB. Gastrointestinal: Soft and nontender. No distention.  Bowel sounds normoactive x4 quadrants. Musculoskeletal: Nontender thoracic and lumbar spine palpation posteriorly.  Patient is able move upper and lower extremities without any difficulty.  Good muscle strength bilaterally and straight leg raises are negative. Neurologic:  Normal speech and language. No gross focal neurologic deficits are appreciated.  Reflexes are equal bilaterally.  No gait instability. Skin:  Skin is warm, dry and intact.  Patient has a lesion on his chest with mild pigmentation and a rough sensation consistent with seborrheic keratitis and also 1 located mid back.  These are nontender.  Patient is not aware of any changes in color or size. Psychiatric: Mood and affect are normal. Speech and behavior are normal.  ____________________________________________   LABS (all labs ordered are listed, but only abnormal results are displayed)  Labs were discussed with patient ____________________________________________  EKG Sinus bradycardia with a ventricular rate of 55   ____________________________________________    FINAL CLINICAL IMPRESSION(S)   59 year old male for annual physical.  Lab work was  discussed.  No findings that are worrisome.  Patient currently is having extensive dental work done and has been on a soft diet for approximately 6 months.  We discussed possibly drinking some protein shakes until his dental work is complete.  The  skin lesions as described above will be watched and patient is aware if there are any changes with color or size we will refer to a dermatologist for a biopsy.     ED Discharge Orders         Ordered    EKG 12-Lead     07/03/19 1547           Note:  This document was prepared using Dragon voice recognition software and may include unintentional dictation errors.

## 2019-12-09 DIAGNOSIS — Z135 Encounter for screening for eye and ear disorders: Secondary | ICD-10-CM | POA: Diagnosis not present

## 2019-12-09 DIAGNOSIS — H524 Presbyopia: Secondary | ICD-10-CM | POA: Diagnosis not present

## 2020-01-09 DIAGNOSIS — H40033 Anatomical narrow angle, bilateral: Secondary | ICD-10-CM | POA: Diagnosis not present

## 2020-01-09 DIAGNOSIS — H35371 Puckering of macula, right eye: Secondary | ICD-10-CM | POA: Diagnosis not present

## 2020-01-09 DIAGNOSIS — H2511 Age-related nuclear cataract, right eye: Secondary | ICD-10-CM | POA: Diagnosis not present

## 2020-01-09 DIAGNOSIS — H2512 Age-related nuclear cataract, left eye: Secondary | ICD-10-CM | POA: Diagnosis not present

## 2020-02-07 ENCOUNTER — Ambulatory Visit: Payer: Self-pay

## 2020-02-07 ENCOUNTER — Other Ambulatory Visit: Payer: Self-pay

## 2020-02-07 DIAGNOSIS — Z23 Encounter for immunization: Secondary | ICD-10-CM

## 2020-04-16 DIAGNOSIS — H2511 Age-related nuclear cataract, right eye: Secondary | ICD-10-CM | POA: Diagnosis not present

## 2020-05-04 DIAGNOSIS — H2511 Age-related nuclear cataract, right eye: Secondary | ICD-10-CM | POA: Diagnosis not present

## 2020-05-11 DIAGNOSIS — H2512 Age-related nuclear cataract, left eye: Secondary | ICD-10-CM | POA: Diagnosis not present

## 2020-06-11 ENCOUNTER — Ambulatory Visit: Payer: Self-pay

## 2020-06-11 ENCOUNTER — Other Ambulatory Visit: Payer: Self-pay

## 2020-06-11 DIAGNOSIS — Z01818 Encounter for other preprocedural examination: Secondary | ICD-10-CM

## 2020-06-11 LAB — POCT URINALYSIS DIPSTICK
Bilirubin, UA: NEGATIVE
Blood, UA: NEGATIVE
Glucose, UA: NEGATIVE
Ketones, UA: NEGATIVE
Leukocytes, UA: NEGATIVE
Nitrite, UA: NEGATIVE
Protein, UA: POSITIVE — AB
Spec Grav, UA: >=1.03 — AB (ref 1.010–1.025)
Urobilinogen, UA: 0.2 E.U./dL
pH, UA: 5.5 (ref 5.0–8.0)

## 2020-06-11 NOTE — Progress Notes (Signed)
Pt scheduled to complete physical 06/18/20 with Hope Neese,FNP.  CL,RMA 

## 2020-06-12 LAB — CMP12+LP+TP+TSH+6AC+PSA+CBC…
ALT: 15 IU/L (ref 0–44)
AST: 26 IU/L (ref 0–40)
Albumin/Globulin Ratio: 1.7 (ref 1.2–2.2)
Albumin: 4.7 g/dL (ref 3.8–4.9)
Alkaline Phosphatase: 79 IU/L (ref 44–121)
BUN/Creatinine Ratio: 12 (ref 10–24)
BUN: 13 mg/dL (ref 8–27)
Basophils Absolute: 0.1 10*3/uL (ref 0.0–0.2)
Basos: 1 %
Bilirubin Total: 0.6 mg/dL (ref 0.0–1.2)
Calcium: 9.8 mg/dL (ref 8.6–10.2)
Chloride: 104 mmol/L (ref 96–106)
Chol/HDL Ratio: 3.4 ratio (ref 0.0–5.0)
Cholesterol, Total: 196 mg/dL (ref 100–199)
Creatinine, Ser: 1.1 mg/dL (ref 0.76–1.27)
EOS (ABSOLUTE): 0.3 10*3/uL (ref 0.0–0.4)
Eos: 5 %
Estimated CHD Risk: 0.5 times avg. (ref 0.0–1.0)
Free Thyroxine Index: 2.8 (ref 1.2–4.9)
GGT: 15 IU/L (ref 0–65)
Globulin, Total: 2.8 g/dL (ref 1.5–4.5)
Glucose: 94 mg/dL (ref 65–99)
HDL: 58 mg/dL (ref >39)
Hematocrit: 46.3 % (ref 37.5–51.0)
Hemoglobin: 15.6 g/dL (ref 13.0–17.7)
Immature Grans (Abs): 0 10*3/uL (ref 0.0–0.1)
Immature Granulocytes: 0 %
Iron: 98 ug/dL (ref 38–169)
LDH: 163 IU/L (ref 121–224)
LDL Chol Calc (NIH): 130 mg/dL — ABNORMAL HIGH (ref 0–99)
Lymphocytes Absolute: 2.1 10*3/uL (ref 0.7–3.1)
Lymphs: 33 %
MCH: 30.2 pg (ref 26.6–33.0)
MCHC: 33.7 g/dL (ref 31.5–35.7)
MCV: 90 fL (ref 79–97)
Monocytes Absolute: 0.5 10*3/uL (ref 0.1–0.9)
Monocytes: 8 %
Neutrophils Absolute: 3.3 10*3/uL (ref 1.4–7.0)
Neutrophils: 53 %
Phosphorus: 3.7 mg/dL (ref 2.8–4.1)
Platelets: 291 10*3/uL (ref 150–450)
Potassium: 4.3 mmol/L (ref 3.5–5.2)
Prostate Specific Ag, Serum: 1.7 ng/mL (ref 0.0–4.0)
RBC: 5.17 x10E6/uL (ref 4.14–5.80)
RDW: 12.8 % (ref 11.6–15.4)
Sodium: 143 mmol/L (ref 134–144)
T3 Uptake Ratio: 27 % (ref 24–39)
T4, Total: 10.5 ug/dL (ref 4.5–12.0)
TSH: 0.939 u[IU]/mL (ref 0.450–4.500)
Total Protein: 7.5 g/dL (ref 6.0–8.5)
Triglycerides: 42 mg/dL (ref 0–149)
Uric Acid: 4.6 mg/dL (ref 3.8–8.4)
VLDL Cholesterol Cal: 8 mg/dL (ref 5–40)
WBC: 6.4 10*3/uL (ref 3.4–10.8)
eGFR: 77 mL/min/{1.73_m2} (ref >59)

## 2020-06-18 ENCOUNTER — Other Ambulatory Visit: Payer: Self-pay

## 2020-06-18 ENCOUNTER — Encounter: Payer: Self-pay | Admitting: Nurse Practitioner

## 2020-06-18 ENCOUNTER — Ambulatory Visit: Payer: Self-pay | Admitting: Nurse Practitioner

## 2020-06-18 VITALS — BP 127/68 | HR 64 | Temp 97.9°F | Resp 14 | Ht 63.0 in | Wt 145.0 lb

## 2020-06-18 DIAGNOSIS — Z0000 Encounter for general adult medical examination without abnormal findings: Secondary | ICD-10-CM

## 2020-06-18 NOTE — Patient Instructions (Signed)

## 2020-06-18 NOTE — Progress Notes (Signed)
Subjective:    Patient ID: Patrick Benson, male    DOB: 1960-05-02, 60 y.o.   MRN: [id number]  HPI Patrick Benson is a 60 y.o. male who presents to the [facility] for his annual physical exam. He is employed in [employer] department and has been there for 7 years. Patient reports that he is having no problems currently. Three years ago he had an elevated PSA (7.2) and was referred to Urology. He underwent a prostate biopsy that was normal.  His f/u PSA's have been normal and he was released from the Urologist. He did recently have cataract surgery that went very well and he no longer needs his glasses.   Past Medical History:  Diagnosis Date  . Elevated PSA   . Hyperthyroidism   . Hypothyroidism    Past Surgical History:  Procedure Laterality Date  . broken nose    . COLONOSCOPY WITH PROPOFOL N/A 12/22/2014   Procedure: COLONOSCOPY WITH PROPOFOL;  Surgeon: Scot Jun, MD;  Location: [facility] ENDOSCOPY;  Service: Endoscopy;  Laterality: N/A;  . TONSILECTOMY, ADENOIDECTOMY, BILATERAL MYRINGOTOMY AND TUBES     No current outpatient medications on file prior to visit.   No current facility-administered medications on file prior to visit.   No Known Allergies  Social Hx: Lives with his wife, does a lot of yard work and has a garden. He feels safe at home. He denies tobacco, ETOH or drug use.  Immunizations: UTD   Diet/Exercise: mostly home prepared healthy meals, exercises regularly, works in the yard and garden and has a Chief Financial Officer.    Review of Systems  Constitutional: Negative for chills, fatigue and fever.  HENT: Negative.   Eyes: Negative for redness, itching and visual disturbance.       Recent cataract surgery   Respiratory: Negative for cough, chest tightness and shortness of breath.   Cardiovascular: Negative for chest pain and leg swelling.  Gastrointestinal: Negative for abdominal pain, nausea and vomiting.  Genitourinary: Negative for dysuria.  Musculoskeletal:  Negative for back pain, gait problem, joint swelling and neck pain.  Skin: Negative for rash and wound.  Neurological: Negative for syncope, weakness and headaches.  Hematological: Negative for adenopathy.  Psychiatric/Behavioral: Negative for confusion. The patient is not nervous/anxious.        Objective: BP 127/68   Pulse 64   Temp 97.9 F (36.6 C)   Resp 14   Ht 5\' 3"  (1.6 m)   Wt 145 lb (65.8 kg)   SpO2 98%   BMI 25.69 kg/m     Physical Exam Vitals and nursing note reviewed.  Constitutional:      Appearance: Normal appearance. He is well-developed, well-groomed and normal weight.  HENT:     Head: Normocephalic and atraumatic.     Jaw: There is normal jaw occlusion.     Right Ear: Tympanic membrane, ear canal and external ear normal.     Left Ear: Tympanic membrane, ear canal and external ear normal.     Nose: Nose normal.     Mouth/Throat:     Lips: Pink.     Mouth: Mucous membranes are moist.     Dentition: Normal dentition. No gum lesions.     Tongue: No lesions.     Pharynx: Oropharynx is clear.  Eyes:     General: Lids are normal.     Extraocular Movements: Extraocular movements intact.     Right eye: No nystagmus.     Left eye: No nystagmus.  Conjunctiva/sclera: Conjunctivae normal.     Pupils: Pupils are equal, round, and reactive to light.     Funduscopic exam:    Right eye: No papilledema. Red reflex present.        Left eye: No papilledema. Red reflex present. Neck:     Thyroid: No thyroid mass or thyroid tenderness.     Vascular: Normal carotid pulses. No carotid bruit or JVD.     Trachea: Trachea normal.  Cardiovascular:     Rate and Rhythm: Normal rate and regular rhythm.     Pulses:          Carotid pulses are 2+ on the right side and 2+ on the left side.      Radial pulses are 2+ on the right side and 2+ on the left side.       Dorsalis pedis pulses are 2+ on the right side and 2+ on the left side.     Heart sounds: No murmur  heard.   Pulmonary:     Effort: Pulmonary effort is normal.     Breath sounds: Normal breath sounds and air entry.  Abdominal:     General: Abdomen is flat. Bowel sounds are normal.     Palpations: Abdomen is soft.     Tenderness: There is no abdominal tenderness. There is no right CVA tenderness or left CVA tenderness.  Musculoskeletal:     Cervical back: Normal and normal range of motion. No pain with movement, spinous process tenderness or muscular tenderness.     Thoracic back: No tenderness. No scoliosis.     Lumbar back: Normal. No tenderness. Negative right straight leg raise test and negative left straight leg raise test.     Right lower leg: No tenderness. No edema.     Left lower leg: No tenderness. No edema.  Lymphadenopathy:     Cervical: No cervical adenopathy.  Skin:    General: Skin is warm and dry.  Neurological:     Mental Status: He is alert.     Cranial Nerves: Cranial nerves are intact.     Sensory: Sensation is intact.     Motor: No weakness or pronator drift.     Coordination: Romberg sign negative. Coordination normal. Finger-Nose-Finger Test and Heel to [facility] Test normal.     Gait: Gait is intact.     Deep Tendon Reflexes:     Reflex Scores:      Bicep reflexes are 2+ on the right side and 2+ on the left side.      Brachioradialis reflexes are 2+ on the right side and 2+ on the left side.      Patellar reflexes are 2+ on the right side and 2+ on the left side.    Comments: Ambulatory with steady gait, stands on one foot without difficulty. Straight leg raises without pain. Grips are equal, radial pulses 2+.   Psychiatric:        Attention and Perception: Attention normal.        Mood and Affect: Mood normal.        Behavior: Behavior normal.       Assessment & Plan:  1. Routine general medical examination at a health care facility Discussed with the patient clinical and lab findings and plan of care. Patient given the opportunity to ask questions. All  questioned fully answered and patient voices understanding. He will return in one year or sooner for any problems or concerns.

## 2021-01-12 ENCOUNTER — Other Ambulatory Visit: Payer: Self-pay

## 2021-01-12 ENCOUNTER — Ambulatory Visit: Payer: Self-pay

## 2021-01-12 DIAGNOSIS — Z23 Encounter for immunization: Secondary | ICD-10-CM

## 2021-04-23 ENCOUNTER — Other Ambulatory Visit: Payer: Self-pay

## 2021-04-23 ENCOUNTER — Ambulatory Visit: Payer: Self-pay

## 2021-04-23 VITALS — BP 114/70

## 2021-04-23 DIAGNOSIS — Z0130 Encounter for examination of blood pressure without abnormal findings: Secondary | ICD-10-CM

## 2021-08-20 ENCOUNTER — Ambulatory Visit: Payer: Self-pay

## 2021-08-20 DIAGNOSIS — Z0000 Encounter for general adult medical examination without abnormal findings: Secondary | ICD-10-CM

## 2021-08-20 LAB — POCT URINALYSIS DIPSTICK
Bilirubin, UA: NEGATIVE
Blood, UA: NEGATIVE
Glucose, UA: NEGATIVE
Ketones, UA: NEGATIVE
Leukocytes, UA: NEGATIVE
Nitrite, UA: NEGATIVE
Protein, UA: NEGATIVE
Spec Grav, UA: 1.025 (ref 1.010–1.025)
Urobilinogen, UA: 0.2 E.U./dL
pH, UA: 6 (ref 5.0–8.0)

## 2021-08-21 LAB — CMP12+LP+TP+TSH+6AC+PSA+CBC…
ALT: 20 IU/L (ref 0–44)
AST: 24 IU/L (ref 0–40)
Albumin/Globulin Ratio: 1.8 (ref 1.2–2.2)
Albumin: 4.6 g/dL (ref 3.8–4.8)
Alkaline Phosphatase: 88 IU/L (ref 44–121)
BUN/Creatinine Ratio: 12 (ref 10–24)
BUN: 12 mg/dL (ref 8–27)
Basophils Absolute: 0.1 10*3/uL (ref 0.0–0.2)
Basos: 1 %
Bilirubin Total: 0.3 mg/dL (ref 0.0–1.2)
Calcium: 9.5 mg/dL (ref 8.6–10.2)
Chloride: 103 mmol/L (ref 96–106)
Chol/HDL Ratio: 4.4 ratio (ref 0.0–5.0)
Cholesterol, Total: 218 mg/dL — ABNORMAL HIGH (ref 100–199)
Creatinine, Ser: 1.03 mg/dL (ref 0.76–1.27)
EOS (ABSOLUTE): 0.5 10*3/uL — ABNORMAL HIGH (ref 0.0–0.4)
Eos: 7 %
Estimated CHD Risk: 0.9 times avg. (ref 0.0–1.0)
Free Thyroxine Index: 2.6 (ref 1.2–4.9)
GGT: 19 IU/L (ref 0–65)
Globulin, Total: 2.6 g/dL (ref 1.5–4.5)
Glucose: 92 mg/dL (ref 70–99)
HDL: 49 mg/dL (ref >39)
Hematocrit: 47 % (ref 37.5–51.0)
Hemoglobin: 16 g/dL (ref 13.0–17.7)
Immature Grans (Abs): 0 10*3/uL (ref 0.0–0.1)
Immature Granulocytes: 0 %
Iron: 79 ug/dL (ref 38–169)
LDH: 171 IU/L (ref 121–224)
LDL Chol Calc (NIH): 156 mg/dL — ABNORMAL HIGH (ref 0–99)
Lymphocytes Absolute: 2.6 10*3/uL (ref 0.7–3.1)
Lymphs: 40 %
MCH: 30.5 pg (ref 26.6–33.0)
MCHC: 34 g/dL (ref 31.5–35.7)
MCV: 90 fL (ref 79–97)
Monocytes Absolute: 0.5 10*3/uL (ref 0.1–0.9)
Monocytes: 8 %
Neutrophils Absolute: 2.9 10*3/uL (ref 1.4–7.0)
Neutrophils: 44 %
Phosphorus: 2.9 mg/dL (ref 2.8–4.1)
Platelets: 290 10*3/uL (ref 150–450)
Potassium: 4.4 mmol/L (ref 3.5–5.2)
Prostate Specific Ag, Serum: 5.2 ng/mL — ABNORMAL HIGH (ref 0.0–4.0)
RBC: 5.24 x10E6/uL (ref 4.14–5.80)
RDW: 12.7 % (ref 11.6–15.4)
Sodium: 141 mmol/L (ref 134–144)
T3 Uptake Ratio: 26 % (ref 24–39)
T4, Total: 10 ug/dL (ref 4.5–12.0)
TSH: 1.51 u[IU]/mL (ref 0.450–4.500)
Total Protein: 7.2 g/dL (ref 6.0–8.5)
Triglycerides: 75 mg/dL (ref 0–149)
Uric Acid: 4.2 mg/dL (ref 3.8–8.4)
VLDL Cholesterol Cal: 13 mg/dL (ref 5–40)
WBC: 6.6 10*3/uL (ref 3.4–10.8)
eGFR: 83 mL/min/{1.73_m2} (ref >59)

## 2021-08-24 ENCOUNTER — Ambulatory Visit: Payer: Self-pay | Admitting: Physician Assistant

## 2021-08-24 ENCOUNTER — Encounter: Payer: Self-pay | Admitting: Physician Assistant

## 2021-08-24 VITALS — BP 129/82 | HR 75 | Temp 97.0°F | Resp 16 | Wt 153.5 lb

## 2021-08-24 DIAGNOSIS — R972 Elevated prostate specific antigen [PSA]: Secondary | ICD-10-CM

## 2021-08-24 DIAGNOSIS — Z0000 Encounter for general adult medical examination without abnormal findings: Secondary | ICD-10-CM

## 2021-08-24 NOTE — Progress Notes (Deleted)
?  Pt presents today for physical labs, will return to clinic for scheduled physical.  

## 2021-08-24 NOTE — Progress Notes (Signed)
[facility]  ____________________________________________   None    (approximate)  I have reviewed the triage vital signs and the nursing notes.   HISTORY  Chief Complaint Annual Exam    HPI Patrick Benson is a 61 y.o. male patient presents for annual physical exam.  Patient voiced no concerns or complaints.         Past Medical History:  Diagnosis Date   Elevated PSA    Hyperthyroidism    Hypothyroidism     Patient Active Problem List   Diagnosis Date Noted   Insect bite (nonvenomous) of abdominal wall, initial encounter 11/15/2018   Dermatitis 11/15/2018   Elevated PSA 09/21/2015    Past Surgical History:  Procedure Laterality Date   broken nose     COLONOSCOPY WITH PROPOFOL N/A 12/22/2014   Procedure: COLONOSCOPY WITH PROPOFOL;  Surgeon: Scot Jun, MD;  Location: [facility] ENDOSCOPY;  Service: Endoscopy;  Laterality: N/A;   TONSILECTOMY, ADENOIDECTOMY, BILATERAL MYRINGOTOMY AND TUBES      Prior to Admission medications   Not on File    Allergies Patient has no known allergies.  Family History  Problem Relation Age of Onset   Hematuria Neg Hx    Prostate cancer Neg Hx    Sickle cell anemia Neg Hx     Social History Social History   Tobacco Use   Smoking status: Never   Smokeless tobacco: Current    Types: Snuff  Substance Use Topics   Alcohol use: No   Drug use: No    Review of Systems Constitutional: No fever/chills Eyes: No visual changes. ENT: No sore throat. Cardiovascular: Denies chest pain. Respiratory: Denies shortness of breath. Gastrointestinal: No abdominal pain.  No nausea, no vomiting.  No diarrhea.  No constipation. Genitourinary: Negative for dysuria. Musculoskeletal: Negative for back pain. Skin: Negative for rash. Neurological: Negative for headaches, focal weakness or numbness.   ____________________________________________   PHYSICAL EXAM:  VITAL SIGNS: BP is 129/82,  pulse 75, respirations 16, temperature 97 and patient 97% O2 sat on room air.  Patient weighs 153 pounds BMI is 27.19 Constitutional: Alert and oriented. Well appearing and in no acute distress. Eyes: Conjunctivae are normal. PERRL. EOMI. Head: Atraumatic. Nose: No congestion/rhinnorhea. Mouth/Throat: Mucous membranes are moist.  Oropharynx non-erythematous. Neck: No stridor.  No cervical spine tenderness to palpation. Hematological/Lymphatic/Immunilogical: No cervical lymphadenopathy. Cardiovascular: Normal rate, regular rhythm. Grossly normal heart sounds.  Good peripheral circulation. Respiratory: Normal respiratory effort.  No retractions. Lungs CTAB. Gastrointestinal: Soft and nontender. No distention. No abdominal bruits. No CVA tenderness. Genitourinary: Deferred Musculoskeletal: No lower extremity tenderness nor edema.  No joint effusions. Neurologic:  Normal speech and language. No gross focal neurologic deficits are appreciated. No gait instability. Skin:  Skin is warm, dry and intact. No rash noted. Psychiatric: Mood and affect are normal. Speech and behavior are normal.  ____________________________________________   LABS       Component Ref Range & Units 4 d ago 1 yr ago 2 yr ago  Color, UA  yellow  dark yellow  Dark Yellow   Clarity, UA  clear  clear  Clear   Glucose, UA Negative Negative  Negative  Negative   Bilirubin, UA  negative  negative  Negative   Ketones, UA  negative  negative  Negative   Spec Grav, UA 1.010 - 1.025 1.025  >=1.030 Abnormal   >=1.030 Abnormal    Blood, UA  negative  negative  Negative   pH, UA 5.0 -  8.0 6.0  5.5  5.5   Protein, UA Negative Negative  Positive Abnormal   Negative   Urobilinogen, UA 0.2 or 1.0 E.U./dL 0.2  0.2  0.2   Nitrite, UA  negative  negative  Negative   Leukocytes, UA Negative Negative  Negative  Negative   Appearance   medium     Odor                        Other Results from 08/20/2021   Contains abnormal  data CMP12+LP+TP+TSH+6AC+PSA+CBC. Order: [id number] Status: Final result    Visible to patient: Yes (not seen)    Next appt: None    Dx: Routine adult health maintenance    0 Result Notes       Component Ref Range & Units 4 d ago 1 yr ago 2 yr ago  Glucose 70 - 99 mg/dL 92  94 R  96 R   Uric Acid 3.8 - 8.4 mg/dL 4.2  4.6 CM  4.9 CM   Comment:            Therapeutic target for gout patients: <6.0  BUN 8 - 27 mg/dL 12  13  11  R   Creatinine, Ser 0.76 - 1.27 mg/dL 0.63  0.16  0.10   eGFR >59 mL/min/1.73 83  77    BUN/Creatinine Ratio 10 - 24 12  12  11  R   Sodium 134 - 144 mmol/L 141  143  141   Potassium 3.5 - 5.2 mmol/L 4.4  4.3  4.2   Chloride 96 - 106 mmol/L 103  104  105   Calcium 8.6 - 10.2 mg/dL 9.5  9.8  9.4 R   Phosphorus 2.8 - 4.1 mg/dL 2.9  3.7  3.9   Total Protein 6.0 - 8.5 g/dL 7.2  7.5  7.2   Albumin 3.8 - 4.8 g/dL 4.6  4.7 R  4.3 R   Globulin, Total 1.5 - 4.5 g/dL 2.6  2.8  2.9   Albumin/Globulin Ratio 1.2 - 2.2 1.8  1.7  1.5   Bilirubin Total 0.0 - 1.2 mg/dL 0.3  0.6  0.3   Alkaline Phosphatase 44 - 121 IU/L 88  79  88 R   LDH 121 - 224 IU/L 171  163  178   AST 0 - 40 IU/L 24  26  24    ALT 0 - 44 IU/L 20  15  28    GGT 0 - 65 IU/L 19  15  34   Iron 38 - 169 ug/dL 79  98  57   Cholesterol, Total 100 - 199 mg/dL 932 High   355  732 High    Triglycerides 0 - 149 mg/dL 75  42  98   HDL >20 mg/dL 49  58  51   VLDL Cholesterol Cal 5 - 40 mg/dL 13  8  18    LDL Chol Calc (NIH) 0 - 99 mg/dL 254 High   270 High   623 High    Chol/HDL Ratio 0.0 - 5.0 ratio 4.4  3.4 CM  4.1 CM   Comment:                                   T. Chol/HDL Ratio  Men  Women                                1/2 Avg.Risk  3.4    3.3                                    Avg.Risk  5.0    4.4                                 2X Avg.Risk  9.6    7.1                                 3X Avg.Risk 23.4   11.0   Estimated CHD Risk 0.0 - 1.0 times avg. 0.9  0.5 CM  0.8 CM    Comment: The CHD Risk is based on the T. Chol/HDL ratio. Other  factors affect CHD Risk such as hypertension, smoking,  diabetes, severe obesity, and family history of  premature CHD.   TSH 0.450 - 4.500 uIU/mL 1.510  0.939  1.170   T4, Total 4.5 - 12.0 ug/dL 16.1  09.6  8.7   T3 Uptake Ratio 24 - 39 % 26  27  26    Free Thyroxine Index 1.2 - 4.9 2.6  2.8  2.3   Prostate Specific Ag, Serum 0.0 - 4.0 ng/mL 5.2 High   1.7 CM  1.7 CM   Comment: Roche ECLIA methodology.  According to the American Urological Association, Serum PSA should  decrease and remain at undetectable levels after radical  prostatectomy. The AUA defines biochemical recurrence as an initial  PSA value 0.2 ng/mL or greater followed by a subsequent confirmatory  PSA value 0.2 ng/mL or greater.  Values obtained with different assay methods or kits cannot be used  interchangeably. Results cannot be interpreted as absolute evidence  of the presence or absence of malignant disease.   WBC 3.4 - 10.8 x10E3/uL 6.6  6.4  8.3   RBC 4.14 - 5.80 x10E6/uL 5.24  5.17  5.27   Hemoglobin 13.0 - 17.7 g/dL 04.5  40.9  81.1   Hematocrit 37.5 - 51.0 % 47.0  46.3  46.3   MCV 79 - 97 fL 90  90  88   MCH 26.6 - 33.0 pg 30.5  30.2  30.9   MCHC 31.5 - 35.7 g/dL 91.4  78.2  95.6   RDW 11.6 - 15.4 % 12.7  12.8  12.7   Platelets 150 - 450 x10E3/uL 290  291  302   Neutrophils Not Estab. % 44  53  53   Lymphs Not Estab. % 40  33  33   Monocytes Not Estab. % 8  8  8    Eos Not Estab. % 7  5  5    Basos Not Estab. % 1  1  1    Neutrophils Absolute 1.4 - 7.0 x10E3/uL 2.9  3.3  4.5   Lymphocytes Absolute 0.7 - 3.1 x10E3/uL 2.6  2.1  2.8   Monocytes Absolute 0.1 - 0.9 x10E3/uL 0.5  0.5  0.6   EOS (ABSOLUTE) 0.0 - 0.4 x10E3/uL 0.5 High   0.3  0.4   Basophils Absolute 0.0 - 0.2 x10E3/uL 0.1  0.1  0.1  Immature Granulocytes Not Estab. % 0  0  0   Immature Grans (         ____________________________________________  EKG  Sinus  Bradycardia at  50 bpm -RSR(V1) -nondiagnostic.   PROBABLY NORMAL  ____________________________________________    ____________________________________________   INITIAL IMPRESSION / ASSESSMENT AND PLAN  As part of my medical decision making, I reviewed the following data within the electronic MEDICAL RECORD NUMBER      Discussed lab and EKG results with patient.  Patient PSA is elevated and a consult to urology will be generated.        ____________________________________________   FINAL CLINICAL IMPRESSION  Well exam   ED Discharge Orders     None        Note:  This document was prepared using Dragon voice recognition software and may include unintentional dictation errors.

## 2021-09-02 ENCOUNTER — Encounter: Payer: Self-pay | Admitting: Urology

## 2021-09-02 ENCOUNTER — Ambulatory Visit (INDEPENDENT_AMBULATORY_CARE_PROVIDER_SITE_OTHER): Payer: 59 | Admitting: Urology

## 2021-09-02 VITALS — BP 150/79 | HR 80 | Ht 63.0 in | Wt 150.0 lb

## 2021-09-02 DIAGNOSIS — R972 Elevated prostate specific antigen [PSA]: Secondary | ICD-10-CM

## 2021-09-02 NOTE — Progress Notes (Signed)
09/02/21 1:16 PM   Patrick Benson 1960/03/26 [id number]  CC: Elevated PSA  HPI: 61 year old healthy male referred for a mildly elevated PSA of 5.2 from a baseline of 1.7 over the last 2 years.  He has a history of volatile PSA increasing to 3.9 in April 2017 followed by 7.2 in July 2017 with 7% free which prompted a prostate biopsy.  This was performed by Dr. Berneice Heinrich in September 2017 and showed a 50 g gland, and showed only benign tissue and prostate inflammation.  He has not been seen by urology since that time, and PSA decreased back to the baseline and has been 1.7 over the last few years.  He denies any urinary symptoms or gross hematuria.   PMH: Past Medical History:  Diagnosis Date   Elevated PSA    Hyperthyroidism    Hypothyroidism     Surgical History: Past Surgical History:  Procedure Laterality Date   broken nose     COLONOSCOPY WITH PROPOFOL N/A 12/22/2014   Procedure: COLONOSCOPY WITH PROPOFOL;  Surgeon: Scot Jun, MD;  Location: [facility] ENDOSCOPY;  Service: Endoscopy;  Laterality: N/A;   TONSILECTOMY, ADENOIDECTOMY, BILATERAL MYRINGOTOMY AND TUBES      Family History: Family History  Problem Relation Age of Onset   Hematuria Neg Hx    Prostate cancer Neg Hx    Sickle cell anemia Neg Hx     Social History:  reports that he has never smoked. He has never been exposed to tobacco smoke. His smokeless tobacco use includes snuff. He reports that he does not drink alcohol and does not use drugs.  Physical Exam: BP (!) 150/79   Pulse 80   Ht 5\' 3"  (1.6 m)   Wt 150 lb (68 kg)   BMI 26.57 kg/m    Constitutional:  Alert and oriented, No acute distress. Cardiovascular: No clubbing, cyanosis, or edema. Respiratory: Normal respiratory effort, no increased work of breathing. GI: Abdomen is soft, nontender, nondistended, no abdominal masses DRE: 70 g, smooth, no nodules or masses  Laboratory Data: PSA history reviewed, see HPI  Assessment & Plan:   61 year old  male referred for a mildly elevated PSA of 5.2 from 1.7 over the last few years.  He actually has a history of an elevated PSA of 7.2(7% free) in 2017 with a negative biopsy showing only benign prostate tissue and 1 core of inflammation.  We reviewed the implications of an elevated PSA and the uncertainty surrounding it. In general, a man's PSA increases with age and is produced by both normal and cancerous prostate tissue. The differential diagnosis for elevated PSA includes BPH, prostate cancer, infection, recent intercourse/ejaculation, recent urethroscopic manipulation (foley placement/cystoscopy) or trauma, and prostatitis.   Management of an elevated PSA can include observation or prostate biopsy and we discussed this in detail. Our goal is to detect clinically significant prostate cancers, and manage with either active surveillance, surgery, or radiation for localized disease. Risks of prostate biopsy include bleeding, infection (including life threatening sepsis), pain, and lower urinary symptoms. Hematuria, hematospermia, and blood in the stool are all common after biopsy and can persist up to 4 weeks.   We discussed options including repeat PSA in 3 to 6 months, prostate MRI, or repeat prostate biopsy.  Using shared decision making he opted for a repeat PSA in 6 months which is very reasonable.  If PSA continues to rise, would consider prostate MRI.  2018, MD 09/02/2021  [facility] [address], [address]  [place], [place] [address] [phone]

## 2021-09-02 NOTE — Patient Instructions (Signed)

## 2022-03-07 ENCOUNTER — Other Ambulatory Visit: Payer: Self-pay | Admitting: Physician Assistant

## 2022-03-07 ENCOUNTER — Other Ambulatory Visit: Payer: 59

## 2022-03-07 DIAGNOSIS — R972 Elevated prostate specific antigen [PSA]: Secondary | ICD-10-CM

## 2022-03-07 MED ORDER — HYDROCORT-PRAMOXINE (PERIANAL) 1-1 % EX FOAM: 1.0000 | Freq: Two times a day (BID) | CUTANEOUS | 2 refills | Status: DC

## 2022-03-08 ENCOUNTER — Telehealth: Payer: Self-pay

## 2022-03-08 LAB — PSA TOTAL (REFLEX TO FREE): Prostate Specific Ag, Serum: 2.1 ng/mL (ref 0.0–4.0)

## 2022-03-08 NOTE — Telephone Encounter (Signed)
Can you review PSA and advise.

## 2022-03-08 NOTE — Telephone Encounter (Signed)
Patient called in today and states that he can see his lab results which is why he was coming in. Reports that since his results were normal he would like to cancel this appt.

## 2022-03-10 ENCOUNTER — Ambulatory Visit: Payer: 59 | Admitting: Urology

## 2022-06-22 ENCOUNTER — Ambulatory Visit: Payer: Self-pay

## 2022-06-22 VITALS — BP 130/77 | HR 104 | Temp 98.2°F | Resp 16 | Ht 63.0 in | Wt 147.6 lb

## 2022-06-22 DIAGNOSIS — Z0000 Encounter for general adult medical examination without abnormal findings: Secondary | ICD-10-CM

## 2022-06-22 NOTE — Progress Notes (Signed)
Stated for approximately one month he has had the following complaints: always feeling hot to touch without a fever, trouble sleeping due to sweating, weight loss approximately 10-15lbs (stated at home naked he weighs 139) occasional shakiness of legs during use or in hands while holding something.  Denies any regular cough, chest pain, dysuria or any other complaint of.  Stated only taking a multivitamin.  Stated concern about thyroid and had an uneventful throid ultrasound in the past.  Stated he had some med prescribed before but does not take it now.  EKG performed and will return Friday for fasting labs and urine with physical rescheduled for Tuesday.  Stated understanding to report to ER if chest pain or shortness of breath.

## 2022-06-24 ENCOUNTER — Ambulatory Visit: Payer: Self-pay

## 2022-06-24 DIAGNOSIS — Z0000 Encounter for general adult medical examination without abnormal findings: Secondary | ICD-10-CM

## 2022-06-24 LAB — POCT URINALYSIS DIPSTICK
Bilirubin, UA: NEGATIVE
Blood, UA: NEGATIVE
Glucose, UA: NEGATIVE
Ketones, UA: NEGATIVE
Leukocytes, UA: NEGATIVE
Nitrite, UA: NEGATIVE
Protein, UA: NEGATIVE
Spec Grav, UA: 1.025 (ref 1.010–1.025)
Urobilinogen, UA: 0.2 E.U./dL
pH, UA: 6 (ref 5.0–8.0)

## 2022-06-24 NOTE — Progress Notes (Signed)
Pt completed labs for annual physical. 

## 2022-06-25 LAB — CMP12+LP+TP+TSH+6AC+PSA+CBC…
ALT: 29 IU/L (ref 0–44)
AST: 18 IU/L (ref 0–40)
Albumin/Globulin Ratio: 2 (ref 1.2–2.2)
Albumin: 4.7 g/dL (ref 3.9–4.9)
Alkaline Phosphatase: 85 IU/L (ref 44–121)
BUN/Creatinine Ratio: 12 (ref 10–24)
BUN: 14 mg/dL (ref 8–27)
Basophils Absolute: 0.1 10*3/uL (ref 0.0–0.2)
Basos: 1 %
Bilirubin Total: 0.5 mg/dL (ref 0.0–1.2)
Calcium: 9.7 mg/dL (ref 8.6–10.2)
Chloride: 103 mmol/L (ref 96–106)
Chol/HDL Ratio: 4.1 ratio (ref 0.0–5.0)
Cholesterol, Total: 194 mg/dL (ref 100–199)
Creatinine, Ser: 1.19 mg/dL (ref 0.76–1.27)
EOS (ABSOLUTE): 0.3 10*3/uL (ref 0.0–0.4)
Eos: 5 %
Estimated CHD Risk: 0.8 times avg. (ref 0.0–1.0)
Free Thyroxine Index: 2 (ref 1.2–4.9)
GGT: 22 IU/L (ref 0–65)
Globulin, Total: 2.3 g/dL (ref 1.5–4.5)
Glucose: 83 mg/dL (ref 70–99)
HDL: 47 mg/dL (ref >39)
Hematocrit: 42.3 % (ref 37.5–51.0)
Hemoglobin: 14.2 g/dL (ref 13.0–17.7)
Immature Grans (Abs): 0 10*3/uL (ref 0.0–0.1)
Immature Granulocytes: 0 %
Iron: 62 ug/dL (ref 38–169)
LDH: 163 IU/L (ref 121–224)
LDL Chol Calc (NIH): 133 mg/dL — ABNORMAL HIGH (ref 0–99)
Lymphocytes Absolute: 2.9 10*3/uL (ref 0.7–3.1)
Lymphs: 41 %
MCH: 29 pg (ref 26.6–33.0)
MCHC: 33.6 g/dL (ref 31.5–35.7)
MCV: 86 fL (ref 79–97)
Monocytes Absolute: 0.8 10*3/uL (ref 0.1–0.9)
Monocytes: 11 %
Neutrophils Absolute: 2.9 10*3/uL (ref 1.4–7.0)
Neutrophils: 42 %
Phosphorus: 3.2 mg/dL (ref 2.8–4.1)
Platelets: 273 10*3/uL (ref 150–450)
Potassium: 4.7 mmol/L (ref 3.5–5.2)
Prostate Specific Ag, Serum: 0.5 ng/mL (ref 0.0–4.0)
RBC: 4.9 x10E6/uL (ref 4.14–5.80)
RDW: 11.9 % (ref 11.6–15.4)
Sodium: 141 mmol/L (ref 134–144)
T3 Uptake Ratio: 26 % (ref 24–39)
T4, Total: 7.6 ug/dL (ref 4.5–12.0)
TSH: 3.18 u[IU]/mL (ref 0.450–4.500)
Total Protein: 7 g/dL (ref 6.0–8.5)
Triglycerides: 77 mg/dL (ref 0–149)
Uric Acid: 5.9 mg/dL (ref 3.8–8.4)
VLDL Cholesterol Cal: 14 mg/dL (ref 5–40)
WBC: 7 10*3/uL (ref 3.4–10.8)
eGFR: 69 mL/min/{1.73_m2} (ref >59)

## 2022-06-28 ENCOUNTER — Encounter: Payer: Self-pay | Admitting: Physician Assistant

## 2022-06-28 ENCOUNTER — Ambulatory Visit: Payer: Self-pay | Admitting: Physician Assistant

## 2022-06-28 ENCOUNTER — Other Ambulatory Visit: Payer: Self-pay | Admitting: Physician Assistant

## 2022-06-28 VITALS — BP 136/68 | HR 104 | Temp 97.6°F | Resp 14 | Ht 63.0 in | Wt 147.0 lb

## 2022-06-28 DIAGNOSIS — K641 Second degree hemorrhoids: Secondary | ICD-10-CM

## 2022-06-28 DIAGNOSIS — Z0000 Encounter for general adult medical examination without abnormal findings: Secondary | ICD-10-CM

## 2022-06-28 DIAGNOSIS — I471 Supraventricular tachycardia, unspecified: Secondary | ICD-10-CM

## 2022-06-28 MED ORDER — HYDROCORT-PRAMOXINE (PERIANAL) 1-1 % EX FOAM: 1.0000 | Freq: Two times a day (BID) | CUTANEOUS | 2 refills | Status: DC

## 2022-06-28 NOTE — Progress Notes (Signed)
Pt presents today to complete physical, pt has no concerns or issues.

## 2022-06-28 NOTE — Progress Notes (Signed)
[facility] e  ____________________________________________   None    (approximate)  I have reviewed the triage vital signs and the nursing notes.   HISTORY  Chief Complaint No chief complaint on file.   HPI Patrick Benson is a 62 y.o. male patient presents for annual physical exam.  Patient voiced concern for intermittent heart palpitations.  Patient also states lost 15 pounds since last exam.  Review of records show the patient weight 153 pounds last year and now weighs 147 pounds.  Patient also complain of intermitting protruding hemorrhoids with bleeding.  States condition improved using Proctofoam.        Past Medical History:  Diagnosis Date   Elevated PSA    Hyperthyroidism    Hypothyroidism     Patient Active Problem List   Diagnosis Date Noted   Insect bite (nonvenomous) of abdominal wall, initial encounter 11/15/2018   Dermatitis 11/15/2018   Elevated PSA 09/21/2015    Past Surgical History:  Procedure Laterality Date   broken nose     COLONOSCOPY WITH PROPOFOL N/A 12/22/2014   Procedure: COLONOSCOPY WITH PROPOFOL;  Surgeon: Scot Jun, MD;  Location[facility] ENDOSCOPY;  Service: Endoscopy;  Laterality: N/A;   TONSILECTOMY, ADENOIDECTOMY, BILATERAL MYRINGOTOMY AND TUBES      Prior to Admission medications   Medication Sig Start Date End Date Taking? Authorizing Provider  Multiple Vitamins-Minerals (MENS 50+ MULTI VITAMIN/MIN PO) Take 1 tablet by mouth once.   Yes [provider]  hydrocortisone-pramoxine (PROCTOFOAM-HC) rectal foam Place 1 applicator rectally 2 (two) times daily. Patient not taking: Reported on 06/22/2022 03/07/22   Joni Reining, PA-C    Allergies Patient has no known allergies.  Family History  Problem Relation Age of Onset   Hematuria Neg Hx    Prostate cancer Neg Hx    Sickle cell anemia Neg Hx     Social History Social History   Tobacco Use   Smoking status: Never     Passive exposure: Never   Smokeless tobacco: Current    Types: Snuff  Substance Use Topics   Alcohol use: No   Drug use: No    Review of Systems Constitutional: No fever/chills Eyes: No visual changes. ENT: No sore throat. Cardiovascular: Denies chest pain. Respiratory: Denies shortness of breath. Gastrointestinal: No abdominal pain.  No nausea, no vomiting.  No diarrhea.  No constipation. Genitourinary: Negative for dysuria. Musculoskeletal: Negative for back pain. Skin: Negative for rash. Neurological: Negative for headaches, focal weakness or numbness. ____________________________________________   PHYSICAL EXAM:  VITAL SIGNS: BP 136/68  Pulse 104  Resp 14  Temp 97.6 F (36.4 C)  Temp src Temporal  SpO2 95 %  Weight 147 lb (66.7 kg)  Height 5\' 3"  (1.6 m)   BMI 26.04 kg/m2  BSA 1.72 m2  Constitutional: Alert and oriented. Well appearing and in no acute distress. Eyes: Conjunctivae are normal. PERRL. EOMI. Head: Atraumatic. Nose: No congestion/rhinnorhea. Mouth/Throat: Mucous membranes are moist.  Oropharynx non-erythematous. Neck: No stridor. No cervical spine tenderness to palpation. Hematological/Lymphatic/Immunilogical: No cervical lymphadenopathy. Cardiovascular: Tachycardic at 180 bpm.  Regular rhythm. Grossly normal heart sounds.  Good peripheral circulation. Respiratory: Normal respiratory effort.  No retractions. Lungs CTAB. Gastrointestinal: Soft and nontender. No distention. No abdominal bruits. No CVA tenderness.  Protruding nonthrombosed rectal hemorrhoid Genitourinary: Deferred Musculoskeletal: No lower extremity tenderness nor edema.  No joint effusions. Neurologic:  Normal speech and language. No gross focal neurologic deficits are appreciated. No gait instability. Skin:  Skin is warm, dry and intact. No rash noted. Psychiatric: Mood and affect are normal. Speech and behavior are normal.  ____________________________________________    LABS ____________________________________________  EKG  Sinus rhythm at 98 bpm ____________________________________________    ____________________________________________   INITIAL IMPRESSION / ASSESSMENT AND PLAN  As part of my medical decision making, I reviewed the following data within the electronic MEDICAL RECORD NUMBER       No acute findings on physical exam except for tachycardia and protruding rectal hemorrhoid.  Patient amenable to consult to surgical clinic for definitive evaluation.  Patient will also be consulted to cardiology.  Patient given a prescription for Proctofoam.     ____________________________________________   FINAL CLINICAL IMPRESSION  Well exam  ED Discharge Orders     None        Note:  This document was prepared using Dragon voice recognition software and may include unintentional dictation errors.

## 2022-06-29 NOTE — Addendum Note (Signed)
Addended by: Gardner Candle on: 06/29/2022 10:39 AM   Modules accepted: Orders

## 2022-07-07 ENCOUNTER — Encounter: Payer: Self-pay | Admitting: Surgery

## 2022-07-07 ENCOUNTER — Ambulatory Visit (INDEPENDENT_AMBULATORY_CARE_PROVIDER_SITE_OTHER): Payer: 59 | Admitting: Surgery

## 2022-07-07 VITALS — BP 129/75 | HR 94 | Ht 63.0 in | Wt 146.0 lb

## 2022-07-07 DIAGNOSIS — K641 Second degree hemorrhoids: Secondary | ICD-10-CM | POA: Diagnosis not present

## 2022-07-07 DIAGNOSIS — K644 Residual hemorrhoidal skin tags: Secondary | ICD-10-CM | POA: Insufficient documentation

## 2022-07-07 NOTE — Patient Instructions (Addendum)
Follow up here in 2 months for a recheck and discussion. We will send you a letter about this appointment.   Advised to pursue a goal of 25 to 30 g of fiber daily.  Made aware that the majority of this may be through natural sources, but advised to be aware of actual consumption and to ensure minimal consumption by daily supplementation.  Various forms of supplements discussed.  Recommended Psyllium husk, that mixes well with applesauce, or the powder which goes down well shaken with chocolate milk.  Strongly advised to consume more fluids to ensure adequate hydration, instructed to watch color of urine to determine adequacy of hydration.  Clarity is pursued in urine output, and bowel activity that correlates to significant meal intake.   We need to avoid deferring having bowel movements, advised to take the time at the first sign of sensation, typically following meals, and in the morning.   Subsequent utilization of MiraLAX may be needed ensure at least daily movement, ideally twice daily bowel movements.  If multiple doses of MiraLAX are necessary utilize them. Never skip a day...  To be regular, we must do the above EVERY day.    Hemorrhoids Hemorrhoids are swollen veins that may form: In the butt (rectum). These are called internal hemorrhoids. Around the opening of the butt (anus). These are called external hemorrhoids. Most hemorrhoids do not cause very bad problems. They often get better with changes to your lifestyle and what you eat. What are the causes? Having trouble pooping (constipation) or watery poop (diarrhea). Pushing too hard when you poop. Pregnancy. Being very overweight (obese). Sitting for too long. Riding a bike for a long time. Heavy lifting or other things that take a lot of effort. Anal sex. What are the signs or symptoms? Pain. Itching or soreness in the butt. Bleeding from the butt. Leaking poop. Swelling. One or more lumps around the opening of your  butt. How is this treated? In most cases, hemorrhoids can be treated at home. You may be told to: Change what you eat. Make changes to your lifestyle. If these treatments do not help, you may need to have a procedure done. Your doctor may need to: Place rubber bands at the bottom of the hemorrhoids to make them fall off. Put medicine into the hemorrhoids to shrink them. Shine a type of light on the hemorrhoids to cause them to fall off. Do surgery to get rid of the hemorrhoids. Follow these instructions at home: Medicines Take over-the-counter and prescription medicines only as told by your doctor. Use creams with medicine in them or medicines that you put in your butt as told by your doctor. Eating and drinking  Eat foods that have a lot of fiber in them. These include whole grains, beans, nuts, fruits, and vegetables. Ask your doctor about taking products that have fiber added to them (fibersupplements). Take in less fat. You can do this by: Eating low-fat dairy products. Eating less red meat. Staying away from processed foods. Drink enough fluid to keep your pee (urine) pale yellow. Managing pain and swelling  Take a warm-water bath (sitz bath) for 20 minutes to ease pain. Do this 3-4 times a day. You may do this in a bathtub. You may also use a portable sitz bath that fits over the toilet. If told, put ice on the painful area. It may help to use ice between your warm baths. Put ice in a plastic bag. Place a towel between your skin and the  bag. Leave the ice on for 20 minutes, 2-3 times a day. If your skin turns bright red, take off the ice right away to prevent skin damage. The risk of damage is higher if you cannot feel pain, heat, or cold. General instructions Exercise. Ask your doctor how much and what kind of exercise is best for you. Go to the bathroom when you need to poop. Do not wait. Try not to push too hard when you poop. Keep your butt dry and clean. Use wet toilet  paper or moist towelettes after you poop. Do not sit on the toilet for a long time. Contact a doctor if: You have pain and swelling that do not get better with treatment. You have trouble pooping. You cannot poop. You have pain or swelling outside the area of the hemorrhoids. Get help right away if: You have bleeding from the butt that will not stop. This information is not intended to replace advice given to you by your health care provider. Make sure you discuss any questions you have with your health care provider. Document Revised: 10/27/2021 Document Reviewed: 10/27/2021 Elsevier Patient Education  2023 [employer].

## 2022-07-07 NOTE — Progress Notes (Signed)
Patient ID: Patrick Benson, male   DOB: 1960/05/23, 62 y.o.   MRN: [id number]  Chief Complaint: Hemorrhoids  History of Present Illness Patrick Benson is a 62 y.o. male with 5+ years of hemorrhoid issues.  Significantly worse over the last 2 years.  Reports typically his bowel movements are once daily, require a fair amount of effort and senses the need to push back some tissue that bulges out.  This often requires manual reduction or pressure.  He had used preparation H wipes in the past, currently attempting to use Proctofoam.  Prior history of fiber use was limited to Gummies and did not feel it was terribly helpful.  He is currently utilizing a stool softener, but says he daily movements are still hard.  He sees bright red blood occasionally.  And does not have pain so much as just the need to reduce perceived prolapse.  He has no family history of colon cancer and his last colonoscopy was in 2016 with Dr. Mechele Collin.  Past Medical History Past Medical History:  Diagnosis Date   Elevated PSA    Hyperthyroidism    Hypothyroidism       Past Surgical History:  Procedure Laterality Date   broken nose     COLONOSCOPY WITH PROPOFOL N/A 12/22/2014   Procedure: COLONOSCOPY WITH PROPOFOL;  Surgeon: Scot Jun, MD;  Location: [facility] ENDOSCOPY;  Service: Endoscopy;  Laterality: N/A;   TONSILECTOMY, ADENOIDECTOMY, BILATERAL MYRINGOTOMY AND TUBES      No Known Allergies  Current Outpatient Medications  Medication Sig Dispense Refill   Multiple Vitamins-Minerals (MENS 50+ MULTI VITAMIN/MIN PO) Take 1 tablet by mouth once.     hydrocortisone-pramoxine (PROCTOFOAM-HC) rectal foam Place 1 applicator rectally 2 (two) times daily. (Patient not taking: Reported on 07/07/2022) 10 g 2   No current facility-administered medications for this visit.    Family History Family History  Problem Relation Age of Onset   Hematuria Neg Hx    Prostate cancer Neg Hx    Sickle cell anemia Neg Hx       Social  History Social History   Tobacco Use   Smoking status: Never    Passive exposure: Never   Smokeless tobacco: Current    Types: Snuff  Substance Use Topics   Alcohol use: No   Drug use: No        Review of Systems  Constitutional: Negative.   HENT: Negative.    Eyes: Negative.   Respiratory: Negative.    Cardiovascular:        Elevated heart rate  Gastrointestinal: Negative.   Genitourinary: Negative.   Skin: Negative.   Neurological: Negative.   Psychiatric/Behavioral: Negative.       Physical Exam Blood pressure 129/75, pulse 94, height 5\' 3"  (1.6 m), weight 146 lb (66.2 kg), SpO2 98 %. Last Weight  Most recent update: 07/07/2022  8:53 AM    Weight  66.2 kg (146 lb)             CONSTITUTIONAL: Well developed, and nourished, appropriately responsive and aware without distress.   EYES: Sclera non-icteric.   EARS, NOSE, MOUTH AND THROAT:  The oropharynx is clear. Oral mucosa is pink and moist.    Hearing is intact to voice.  NECK: Trachea is midline, and there is no jugular venous distension.  LYMPH NODES:  Lymph nodes in the neck are not appreciated. RESPIRATORY:  Lungs are clear, and breath sounds are equal bilaterally.  Normal respiratory effort without pathologic use of accessory  muscles. CARDIOVASCULAR: Heart is regular in rate and rhythm.   Well perfused.  GI: The abdomen is  soft, nontender, and nondistended. There were no palpable masses.  GU: DRE: There is near circular circumferential external hemorrhoids that are soft and noninflamed.  Largest is approximately 5 to 7 mm.  There is also circumferential internal hemorrhoids, without remarkable piles or redundancy or mobility.  I suspect the perception of prolapse is likely engorged external, and not necessarily prolapsing internal's. MUSCULOSKELETAL:  Symmetrical muscle tone appreciated in all four extremities.    SKIN: Skin turgor is normal. No pathologic skin lesions appreciated.  NEUROLOGIC:  Motor and  sensation appear grossly normal.  Cranial nerves are grossly without defect. PSYCH:  Alert and oriented to person, place and time. Affect is appropriate for situation.  Data Reviewed I have personally reviewed what is currently available of the patient's imaging, recent labs and medical records.   Labs:     Latest Ref Rng & Units 06/24/2022   10:14 AM 08/20/2021    9:27 AM 06/11/2020    9:04 AM  CBC  WBC 3.4 - 10.8 x10E3/uL 7.0  6.6  6.4   Hemoglobin 13.0 - 17.7 g/dL 16.1  09.6  04.5   Hematocrit 37.5 - 51.0 % 42.3  47.0  46.3   Platelets 150 - 450 x10E3/uL 273  290  291       Latest Ref Rng & Units 06/24/2022   10:14 AM 08/20/2021    9:27 AM 06/11/2020    9:04 AM  CMP  Glucose 70 - 99 mg/dL 83  92  94   BUN 8 - 27 mg/dL 14  12  13    Creatinine 0.76 - 1.27 mg/dL 4.09  8.11  9.14   Sodium 134 - 144 mmol/L 141  141  143   Potassium 3.5 - 5.2 mmol/L 4.7  4.4  4.3   Chloride 96 - 106 mmol/L 103  103  104   Calcium 8.6 - 10.2 mg/dL 9.7  9.5  9.8   Total Protein 6.0 - 8.5 g/dL 7.0  7.2  7.5   Total Bilirubin 0.0 - 1.2 mg/dL 0.5  0.3  0.6   Alkaline Phos 44 - 121 IU/L 85  88  79   AST 0 - 40 IU/L 18  24  26    ALT 0 - 44 IU/L 29  20  15        Imaging: Radiological images reviewed:   Within last 24 hrs: No results found.  Assessment    Internal and external hemorrhoids, grade 2.  Patient Active Problem List   Diagnosis Date Noted   Insect bite (nonvenomous) of abdominal wall, initial encounter 11/15/2018   Dermatitis 11/15/2018   Elevated PSA 09/21/2015    Plan    We discussed options of proceeding with surgery, but as my practice is typically to optimize bowel habits that may have contributed to the development, and may also contribute to a challenging postoperative course.  He would desire to attempt further medical management at this time, so we had a full discussion about fiber. We also discussed topical options of witch hazel or Tucks pads, and perhaps RectiCare would  be beneficial at this time especially the advanced.  Advised to pursue a goal of 25 to 30 g of fiber daily.  Made aware that the majority of this may be through natural sources, but advised to be aware of actual consumption and to ensure minimal consumption by daily supplementation.  Various forms  of supplements discussed.  Recommended Psyllium husk, that mixes well with applesauce, or the powder which goes down well shaken with chocolate milk.  Strongly advised to consume more fluids to ensure adequate hydration, instructed to watch color of urine to determine adequacy of hydration.  Clarity is pursued in urine output, and bowel activity that correlates to significant meal intake.   We need to avoid deferring having bowel movements, advised to take the time at the first sign of sensation, typically following meals, and in the morning.   Subsequent utilization of MiraLAX may be needed ensure at least daily movement, ideally twice daily bowel movements.  If multiple doses of MiraLAX are necessary utilize them. Never skip a day...  To be regular, we must do the above EVERY day.  We also discussed paying attention to the typical gastrocolic reflex and the need to have more frequent movements.  Face-to-face time spent with the patient and accompanying care providers(if present) was 40 minutes, with more than 50% of the time spent counseling, educating, and coordinating care of the patient.    These notes generated with voice recognition software. I apologize for typographical errors.  Campbell Lerner M.D., FACS 07/07/2022, 9:02 AM

## 2022-07-12 ENCOUNTER — Encounter: Payer: 59 | Admitting: Physician Assistant

## 2022-08-26 ENCOUNTER — Ambulatory Visit: Payer: BLUE CROSS/BLUE SHIELD | Admitting: Cardiology

## 2022-09-07 ENCOUNTER — Telehealth: Payer: Self-pay

## 2022-09-07 NOTE — Telephone Encounter (Signed)
Patient confirmed no cardiac history.  Directions to the office provided.

## 2022-09-14 ENCOUNTER — Ambulatory Visit (INDEPENDENT_AMBULATORY_CARE_PROVIDER_SITE_OTHER): Payer: 59

## 2022-09-14 ENCOUNTER — Ambulatory Visit: Payer: 59 | Attending: Cardiology | Admitting: Cardiology

## 2022-09-14 ENCOUNTER — Encounter: Payer: Self-pay | Admitting: Cardiology

## 2022-09-14 VITALS — BP 138/76 | HR 82 | Ht 63.0 in | Wt 148.2 lb

## 2022-09-14 DIAGNOSIS — R002 Palpitations: Secondary | ICD-10-CM

## 2022-09-14 NOTE — Patient Instructions (Signed)
 Medication Instructions:   Your physician recommends that you continue on your current medications as directed. Please refer to the Current Medication list given to you today.  *If you need a refill on your cardiac medications before your next appointment, please call your pharmacy*   Lab Work:  None Ordered  If you have labs (blood work) drawn today and your tests are completely normal, you will receive your results only by: [place] (if you have MyChart) OR A paper copy in the mail If you have any lab test that is abnormal or we need to change your treatment, we will call you to review the results.   Testing/Procedures:  Your physician has recommended that you wear a Zio monitor.   This monitor is a medical device that records the heart's electrical activity. Doctors most often use these monitors to diagnose arrhythmias. Arrhythmias are problems with the speed or rhythm of the heartbeat. The monitor is a small device applied to your chest. You can wear one while you do your normal daily activities. While wearing this monitor if you have any symptoms to push the button and record what you felt. Once you have worn this monitor for the period of time provider prescribed (Usually 14 days), you will return the monitor device in the postage paid box. Once it is returned they will download the data collected and provide [place] with a report which the provider will then review and we will call you with those results. Important tips:  Avoid showering during the first 24 hours of wearing the monitor. Avoid excessive sweating to help maximize wear time. Do not submerge the device, no hot tubs, and no swimming pools. Keep any lotions or oils away from the patch. After 24 hours you may shower with the patch on. Take brief showers with your back facing the shower head.  Do not remove patch once it has been placed because that will interrupt data and decrease adhesive wear time. Push the button  when you have any symptoms and write down what you were feeling. Once you have completed wearing your monitor, remove and place into box which has postage paid and place in your outgoing mailbox.  If for some reason you have misplaced your box then call our office and we can provide another box and/or mail it off for you.      Follow-Up: At [facility], you and your health needs are our priority.  As part of our continuing mission to provide you with exceptional heart care, we have created designated Provider Care Teams.  These Care Teams include your primary Cardiologist (physician) and Advanced Practice Providers (APPs -  Physician Assistants and Nurse Practitioners) who all work together to provide you with the care you need, when you need it.  We recommend signing up for the patient portal called "MyChart".  Sign up information is provided on this After Visit Summary.  MyChart is used to connect with patients for Virtual Visits (Telemedicine).  Patients are able to view lab/test results, encounter notes, upcoming appointments, etc.  Non-urgent messages can be sent to your provider as well.   To learn more about what you can do with MyChart, go to [email or web address].    Your next appointment:   6 - 8 week(s)  Provider:   You may see Kate Sable, MD or one of the following Advanced Practice Providers on your designated Care Team:   Murray Hodgkins, NP Christell Faith, PA-C Cadence Kathlen Mody, PA-C Gerrie Nordmann, NP

## 2022-09-14 NOTE — Progress Notes (Signed)
Cardiology Office Note:    Date:  09/14/2022   ID:  Patrick Benson, DOB [birth year], MRN [id number]  PCP:  Aviva Kluver   [facility] Providers Cardiologist:  Debbe Odea, MD     Referring MD: Joni Reining, PA-C   Chief Complaint  Patient presents with   New Patient (Initial Visit)    Referred for paroxysmal supraventricular tachycardia evaluation.  No cardiac history.   Patrick Benson is a 62 y.o. male who is being seen today for the evaluation of palpitations at the request of Wells Guiles.   History of Present Illness:    Patrick Benson is a 62 y.o. male with no significant past medical history who presented due to palpitations.  Patient states being healthy all his life, denies any personal or family history of heart disease.  His baseline heart rates is typically in the 60s.  About 5 weeks ago, he had episodes of elevated heart rates in the low 100s to 110 bpm.  Symptoms usually occur at night when he is going to sleep.  Endorses prominent heartbeat, which he could hear.  He attributes symptoms to possibly anxiety due to his youngest daughter getting married around this time.  He has since felt okay, last episode was over 4 weeks ago.  Past Medical History:  Diagnosis Date   Elevated PSA    Hyperthyroidism    Hypothyroidism     Past Surgical History:  Procedure Laterality Date   broken nose     COLONOSCOPY WITH PROPOFOL N/A 12/22/2014   Procedure: COLONOSCOPY WITH PROPOFOL;  Surgeon: Scot Jun, MD;  Location: [facility] ENDOSCOPY;  Service: Endoscopy;  Laterality: N/A;   TONSILECTOMY, ADENOIDECTOMY, BILATERAL MYRINGOTOMY AND TUBES      Current Medications: No outpatient medications have been marked as taking for the 09/14/22 encounter (Office Visit) with Debbe Odea, MD.     Allergies:   Patient has no known allergies.   Social History   Socioeconomic History   Marital status: Married    Spouse name: Not on file   Number of children: Not on  file   Years of education: Not on file   Highest education level: Not on file  Occupational History   Not on file  Tobacco Use   Smoking status: Never    Passive exposure: Never   Smokeless tobacco: Current    Types: Snuff  Vaping Use   Vaping status: Never Used  Substance and Sexual Activity   Alcohol use: No   Drug use: No   Sexual activity: Not on file  Other Topics Concern   Not on file  Social History Narrative   Not on file   Social Determinants of Health   Financial Resource Strain: Not on file  Food Insecurity: Not on file  Transportation Needs: Not on file  Physical Activity: Not on file  Stress: Not on file  Social Connections: Not on file     Family History: The patient's family history is negative for Hematuria, Prostate cancer, and Sickle cell anemia.  ROS:   Please see the history of present illness.     All other systems reviewed and are negative.  EKGs/Labs/Other Studies Reviewed:    The following studies were reviewed today:  EKG Interpretation Date/Time:  Wednesday September 14 2022 09:08:40 EDT Ventricular Rate:  82 PR Interval:  164 QRS Duration:  98 QT Interval:  364 QTC Calculation: 425 R Axis:   57  Text Interpretation: Normal sinus rhythm Incomplete right  bundle branch block Nonspecific T wave abnormality Confirmed by Debbe Odea ([id number]) on 09/14/2022 9:16:46 AM    Recent Labs: 06/24/2022: ALT 29; BUN 14; Creatinine, Ser 1.19; Hemoglobin 14.2; Platelets 273; Potassium 4.7; Sodium 141; TSH 3.180  Recent Lipid Panel    Component Value Date/Time   CHOL 194 06/24/2022 1014   TRIG 77 06/24/2022 1014   HDL 47 06/24/2022 1014   CHOLHDL 4.1 06/24/2022 1014   LDLCALC 133 (H) 06/24/2022 1014     Risk Assessment/Calculations:             Physical Exam:    VS:  BP 138/76 (BP Location: Right Arm, Patient Position: Sitting, Cuff Size: Normal)   Pulse 82   Ht 5\' 3"  (1.6 m)   Wt 148 lb 3.2 oz (67.2 kg)   SpO2 96%   BMI 26.25 kg/m      Wt Readings from Last 3 Encounters:  09/14/22 148 lb 3.2 oz (67.2 kg)  07/07/22 146 lb (66.2 kg)  06/28/22 147 lb (66.7 kg)     GEN:  Well nourished, well developed in no acute distress HEENT: Normal NECK: No JVD; No carotid bruits CARDIAC: RRR, no murmurs, rubs, gallops RESPIRATORY:  Clear to auscultation without rales, wheezing or rhonchi  ABDOMEN: Soft, non-tender, non-distended MUSCULOSKELETAL:  No edema; No deformity  SKIN: Warm and dry NEUROLOGIC:  Alert and oriented x 3 PSYCHIATRIC:  Normal affect   ASSESSMENT:    1. Palpitations    PLAN:    In order of problems listed above:  Palpitations, place cardiac monitor to evaluate any significant arrhythmias.  Symptoms appear benign, possibly anxiety related due to daughter getting married.  Will however evaluate with cardiac monitor for any significant arrhythmias.  Follow-up after cardiac monitor.     Medication Adjustments/Labs and Tests Ordered: Current medicines are reviewed at length with the patient today.  Concerns regarding medicines are outlined above.  Orders Placed This Encounter  Procedures   LONG TERM MONITOR (3-14 DAYS)   EKG 12-Lead   No orders of the defined types were placed in this encounter.   Patient Instructions  Medication Instructions:   Your physician recommends that you continue on your current medications as directed. Please refer to the Current Medication list given to you today.   *If you need a refill on your cardiac medications before your next appointment, please call your pharmacy*   Lab Work:  None Ordered  If you have labs (blood work) drawn today and your tests are completely normal, you will receive your results only by: MyChart Message (if you have MyChart) OR A paper copy in the mail If you have any lab test that is abnormal or we need to change your treatment, we will call you to review the results.   Testing/Procedures:  Your physician has recommended that you  wear a Zio monitor.   This monitor is a medical device that records the heart's electrical activity. Doctors most often use these monitors to diagnose arrhythmias. Arrhythmias are problems with the speed or rhythm of the heartbeat. The monitor is a small device applied to your chest. You can wear one while you do your normal daily activities. While wearing this monitor if you have any symptoms to push the button and record what you felt. Once you have worn this monitor for the period of time provider prescribed (Usually 14 days), you will return the monitor device in the postage paid box. Once it is returned they will download the data collected  and provide [place] with a report which the provider will then review and we will call you with those results. Important tips:  Avoid showering during the first 24 hours of wearing the monitor. Avoid excessive sweating to help maximize wear time. Do not submerge the device, no hot tubs, and no swimming pools. Keep any lotions or oils away from the patch. After 24 hours you may shower with the patch on. Take brief showers with your back facing the shower head.  Do not remove patch once it has been placed because that will interrupt data and decrease adhesive wear time. Push the button when you have any symptoms and write down what you were feeling. Once you have completed wearing your monitor, remove and place into box which has postage paid and place in your outgoing mailbox.  If for some reason you have misplaced your box then call our office and we can provide another box and/or mail it off for you.   Follow-Up: At [facility], you and your health needs are our priority.  As part of our continuing mission to provide you with exceptional heart care, we have created designated Provider Care Teams.  These Care Teams include your primary Cardiologist (physician) and Advanced Practice Providers (APPs -  Physician Assistants and Nurse Practitioners) who all  work together to provide you with the care you need, when you need it.  We recommend signing up for the patient portal called "MyChart".  Sign up information is provided on this After Visit Summary.  MyChart is used to connect with patients for Virtual Visits (Telemedicine).  Patients are able to view lab/test results, encounter notes, upcoming appointments, etc.  Non-urgent messages can be sent to your provider as well.   To learn more about what you can do with MyChart, go to [email or web address].    Your next appointment:   6 - 8 week(s)  Provider:   You may see Debbe Odea, MD or one of the following Advanced Practice Providers on your designated Care Team:   Nicolasa Ducking, NP Eula Listen, PA-C Cadence Fransico Michael, PA-C Charlsie Quest, NP    Signed, Debbe Odea, MD  09/14/2022 9:32 AM    [facility]

## 2022-09-15 ENCOUNTER — Ambulatory Visit (INDEPENDENT_AMBULATORY_CARE_PROVIDER_SITE_OTHER): Payer: 59 | Admitting: Surgery

## 2022-09-15 ENCOUNTER — Encounter: Payer: Self-pay | Admitting: Surgery

## 2022-09-15 ENCOUNTER — Ambulatory Visit: Payer: 59 | Admitting: Surgery

## 2022-09-15 VITALS — BP 133/72 | HR 76 | Temp 98.5°F | Ht 63.0 in | Wt 148.8 lb

## 2022-09-15 DIAGNOSIS — K641 Second degree hemorrhoids: Secondary | ICD-10-CM

## 2022-09-15 NOTE — Patient Instructions (Signed)
If you have any concerns or questions, please feel free to call our office.   Hemorrhoids Hemorrhoids are swollen veins that may form: In the butt (rectum). These are called internal hemorrhoids. Around the opening of the butt (anus). These are called external hemorrhoids. Most hemorrhoids do not cause very bad problems. They often get better with changes to your lifestyle and what you eat. What are the causes? Having trouble pooping (constipation) or watery poop (diarrhea). Pushing too hard when you poop. Pregnancy. Being very overweight (obese). Sitting for too long. Riding a bike for a long time. Heavy lifting or other things that take a lot of effort. Anal sex. What are the signs or symptoms? Pain. Itching or soreness in the butt. Bleeding from the butt. Leaking poop. Swelling. One or more lumps around the opening of your butt. How is this treated? In most cases, hemorrhoids can be treated at home. You may be told to: Change what you eat. Make changes to your lifestyle. If these treatments do not help, you may need to have a procedure done. Your doctor may need to: Place rubber bands at the bottom of the hemorrhoids to make them fall off. Put medicine into the hemorrhoids to shrink them. Shine a type of light on the hemorrhoids to cause them to fall off. Do surgery to get rid of the hemorrhoids. Follow these instructions at home: Medicines Take over-the-counter and prescription medicines only as told by your doctor. Use creams with medicine in them or medicines that you put in your butt as told by your doctor. Eating and drinking  Eat foods that have a lot of fiber in them. These include whole grains, beans, nuts, fruits, and vegetables. Ask your doctor about taking products that have fiber added to them (fibersupplements). Take in less fat. You can do this by: Eating low-fat dairy products. Eating less red meat. Staying away from processed foods. Drink enough fluid to  keep your pee (urine) pale yellow. Managing pain and swelling  Take a warm-water bath (sitz bath) for 20 minutes to ease pain. Do this 3-4 times a day. You may do this in a bathtub. You may also use a portable sitz bath that fits over the toilet. If told, put ice on the painful area. It may help to use ice between your warm baths. Put ice in a plastic bag. Place a towel between your skin and the bag. Leave the ice on for 20 minutes, 2-3 times a day. If your skin turns bright red, take off the ice right away to prevent skin damage. The risk of damage is higher if you cannot feel pain, heat, or cold. General instructions Exercise. Ask your doctor how much and what kind of exercise is best for you. Go to the bathroom when you need to poop. Do not wait. Try not to push too hard when you poop. Keep your butt dry and clean. Use wet toilet paper or moist towelettes after you poop. Do not sit on the toilet for a long time. Contact a doctor if: You have pain and swelling that do not get better with treatment. You have trouble pooping. You cannot poop. You have pain or swelling outside the area of the hemorrhoids. Get help right away if: You have bleeding from the butt that will not stop. This information is not intended to replace advice given to you by your health care provider. Make sure you discuss any questions you have with your health care provider. Document Revised: 10/27/2021  Document Reviewed: 10/27/2021 Elsevier Patient Education  2024 [employer].

## 2022-09-16 NOTE — Progress Notes (Signed)
Patient ID: Patrick Benson, male   DOB: [birth year], 62 y.o.   MRN: [id number]  Chief Complaint: Hemorrhoids, follow up  History of Present Illness Since his last visit patient reports the fiber has been helpful, feeling significantly better.  The hemorrhoids have diminished in prominence, but still present.  Reports his bowel movements have been consistent, much easier to manage and denies pain. Past Medical History Past Medical History:  Diagnosis Date   Elevated PSA    Hyperthyroidism    Hypothyroidism       Past Surgical History:  Procedure Laterality Date   broken nose     COLONOSCOPY WITH PROPOFOL N/A 12/22/2014   Procedure: COLONOSCOPY WITH PROPOFOL;  Surgeon: Scot Jun, MD;  Location: [facility] ENDOSCOPY;  Service: Endoscopy;  Laterality: N/A;   TONSILECTOMY, ADENOIDECTOMY, BILATERAL MYRINGOTOMY AND TUBES      No Known Allergies  No current outpatient medications on file.   No current facility-administered medications for this visit.    Family History Family History  Problem Relation Age of Onset   Hematuria Neg Hx    Prostate cancer Neg Hx    Sickle cell anemia Neg Hx       Social History Social History   Tobacco Use   Smoking status: Never    Passive exposure: Never   Smokeless tobacco: Current    Types: Snuff  Vaping Use   Vaping status: Never Used  Substance Use Topics   Alcohol use: No   Drug use: No        Review of Systems  Constitutional: Negative.   HENT: Negative.    Eyes: Negative.   Respiratory: Negative.    Cardiovascular:        Elevated heart rate  Gastrointestinal: Negative.   Genitourinary: Negative.   Skin: Negative.   Neurological: Negative.   Psychiatric/Behavioral: Negative.       Physical Exam Blood pressure 133/72, pulse 76, temperature 98.5 F (36.9 C), temperature source Oral, height 5\' 3"  (1.6 m), weight 148 lb 12.8 oz (67.5 kg), SpO2 97%. Last Weight  Most recent update: 09/15/2022 11:01 AM    Weight  67.5 kg  (148 lb 12.8 oz)             CONSTITUTIONAL: Well developed, and nourished, appropriately responsive and aware without distress.   EYES: Sclera non-icteric.   EARS, NOSE, MOUTH AND THROAT:  The oropharynx is clear. Oral mucosa is pink and moist.    Hearing is intact to voice.  NECK: Trachea is midline, and there is no jugular venous distension.  LYMPH NODES:  Lymph nodes in the neck are not appreciated. RESPIRATORY:   Normal respiratory effort without pathologic use of accessory muscles. CARDIOVASCULAR: Well perfused.  GI: The abdomen is  soft, nontender, and nondistended. There were no palpable masses.  Deferred repeat digital rectal exam today. MUSCULOSKELETAL:  Symmetrical muscle tone appreciated in all four extremities.    SKIN: Skin turgor is normal. No pathologic skin lesions appreciated.  NEUROLOGIC:  Motor and sensation appear grossly normal.  Cranial nerves are grossly without defect. PSYCH:  Alert and oriented to person, place and time. Affect is appropriate for situation.  Data Reviewed I have personally reviewed what is currently available of the patient's imaging, recent labs and medical records.   Labs:     Latest Ref Rng & Units 06/24/2022   10:14 AM 08/20/2021    9:27 AM 06/11/2020    9:04 AM  CBC  WBC 3.4 - 10.8 x10E3/uL 7.0  6.6  6.4   Hemoglobin 13.0 - 17.7 g/dL 78.2  95.6  21.3   Hematocrit 37.5 - 51.0 % 42.3  47.0  46.3   Platelets 150 - 450 x10E3/uL 273  290  291       Latest Ref Rng & Units 06/24/2022   10:14 AM 08/20/2021    9:27 AM 06/11/2020    9:04 AM  CMP  Glucose 70 - 99 mg/dL 83  92  94   BUN 8 - 27 mg/dL 14  12  13    Creatinine 0.76 - 1.27 mg/dL 0.86  5.78  4.69   Sodium 134 - 144 mmol/L 141  141  143   Potassium 3.5 - 5.2 mmol/L 4.7  4.4  4.3   Chloride 96 - 106 mmol/L 103  103  104   Calcium 8.6 - 10.2 mg/dL 9.7  9.5  9.8   Total Protein 6.0 - 8.5 g/dL 7.0  7.2  7.5   Total Bilirubin 0.0 - 1.2 mg/dL 0.5  0.3  0.6   Alkaline Phos 44 - 121  IU/L 85  88  79   AST 0 - 40 IU/L 18  24  26    ALT 0 - 44 IU/L 29  20  15        Imaging: Radiological images reviewed:   Within last 24 hrs: No results found.  Assessment    Internal and external hemorrhoids, grade 2.  Patient Active Problem List   Diagnosis Date Noted   External hemorrhoids without complication 07/07/2022   Grade II hemorrhoids 07/07/2022   Insect bite (nonvenomous) of abdominal wall, initial encounter 11/15/2018   Dermatitis 11/15/2018   Elevated PSA 09/21/2015    Plan    As he is anticipating some international travel, he would like to have something available should he have a significant flareup.  Perhaps [facility] would be beneficial especially the advanced.  Advised to continue his fiber regimen and MiraLAX for rescue well on his journeys. Will be glad to see him back again should he desire after his extended vacation. Face-to-face time spent with the patient and accompanying care providers(if present) was 20 minutes, with more than 50% of the time spent counseling, educating, and coordinating care of the patient.    These notes generated with voice recognition software. I apologize for typographical errors.  Campbell Lerner M.D., FACS 09/16/2022, 5:38 PM

## 2022-09-17 DIAGNOSIS — R002 Palpitations: Secondary | ICD-10-CM

## 2022-10-06 DIAGNOSIS — R002 Palpitations: Secondary | ICD-10-CM | POA: Diagnosis not present

## 2022-10-24 ENCOUNTER — Telehealth: Payer: Self-pay | Admitting: *Deleted

## 2022-10-24 NOTE — Telephone Encounter (Signed)
Received a voicemail from pt requesting a refill for his Carvedilol.  He states his pharmacy is [facility].  Please call pt.

## 2022-10-24 NOTE — Telephone Encounter (Signed)
Patient does not have any medications listed in chart.  Called patient to confirm that he is not taking Carvedilol or any meds so no refill needed.

## 2022-10-25 DIAGNOSIS — M5417 Radiculopathy, lumbosacral region: Secondary | ICD-10-CM | POA: Diagnosis not present

## 2022-10-25 DIAGNOSIS — M5416 Radiculopathy, lumbar region: Secondary | ICD-10-CM | POA: Diagnosis not present

## 2022-10-25 DIAGNOSIS — M9903 Segmental and somatic dysfunction of lumbar region: Secondary | ICD-10-CM | POA: Diagnosis not present

## 2022-10-25 DIAGNOSIS — M9905 Segmental and somatic dysfunction of pelvic region: Secondary | ICD-10-CM | POA: Diagnosis not present

## 2022-10-26 DIAGNOSIS — M9905 Segmental and somatic dysfunction of pelvic region: Secondary | ICD-10-CM | POA: Diagnosis not present

## 2022-10-26 DIAGNOSIS — M5416 Radiculopathy, lumbar region: Secondary | ICD-10-CM | POA: Diagnosis not present

## 2022-10-26 DIAGNOSIS — M5417 Radiculopathy, lumbosacral region: Secondary | ICD-10-CM | POA: Diagnosis not present

## 2022-10-26 DIAGNOSIS — M9903 Segmental and somatic dysfunction of lumbar region: Secondary | ICD-10-CM | POA: Diagnosis not present

## 2022-10-27 DIAGNOSIS — M9905 Segmental and somatic dysfunction of pelvic region: Secondary | ICD-10-CM | POA: Diagnosis not present

## 2022-10-27 DIAGNOSIS — M9903 Segmental and somatic dysfunction of lumbar region: Secondary | ICD-10-CM | POA: Diagnosis not present

## 2022-10-27 DIAGNOSIS — M5417 Radiculopathy, lumbosacral region: Secondary | ICD-10-CM | POA: Diagnosis not present

## 2022-10-27 DIAGNOSIS — M5416 Radiculopathy, lumbar region: Secondary | ICD-10-CM | POA: Diagnosis not present

## 2022-11-01 DIAGNOSIS — M9905 Segmental and somatic dysfunction of pelvic region: Secondary | ICD-10-CM | POA: Diagnosis not present

## 2022-11-01 DIAGNOSIS — M5417 Radiculopathy, lumbosacral region: Secondary | ICD-10-CM | POA: Diagnosis not present

## 2022-11-01 DIAGNOSIS — M9903 Segmental and somatic dysfunction of lumbar region: Secondary | ICD-10-CM | POA: Diagnosis not present

## 2022-11-01 DIAGNOSIS — M5416 Radiculopathy, lumbar region: Secondary | ICD-10-CM | POA: Diagnosis not present

## 2022-11-02 ENCOUNTER — Ambulatory Visit: Payer: BLUE CROSS/BLUE SHIELD | Admitting: Physician Assistant

## 2022-11-02 DIAGNOSIS — M5416 Radiculopathy, lumbar region: Secondary | ICD-10-CM | POA: Diagnosis not present

## 2022-11-02 DIAGNOSIS — M5417 Radiculopathy, lumbosacral region: Secondary | ICD-10-CM | POA: Diagnosis not present

## 2022-11-02 DIAGNOSIS — M9903 Segmental and somatic dysfunction of lumbar region: Secondary | ICD-10-CM | POA: Diagnosis not present

## 2022-11-02 DIAGNOSIS — M9905 Segmental and somatic dysfunction of pelvic region: Secondary | ICD-10-CM | POA: Diagnosis not present

## 2022-11-03 DIAGNOSIS — M9905 Segmental and somatic dysfunction of pelvic region: Secondary | ICD-10-CM | POA: Diagnosis not present

## 2022-11-03 DIAGNOSIS — M5417 Radiculopathy, lumbosacral region: Secondary | ICD-10-CM | POA: Diagnosis not present

## 2022-11-03 DIAGNOSIS — M5416 Radiculopathy, lumbar region: Secondary | ICD-10-CM | POA: Diagnosis not present

## 2022-11-03 DIAGNOSIS — M9903 Segmental and somatic dysfunction of lumbar region: Secondary | ICD-10-CM | POA: Diagnosis not present

## 2022-11-07 DIAGNOSIS — M5416 Radiculopathy, lumbar region: Secondary | ICD-10-CM | POA: Diagnosis not present

## 2022-11-07 DIAGNOSIS — M9905 Segmental and somatic dysfunction of pelvic region: Secondary | ICD-10-CM | POA: Diagnosis not present

## 2022-11-07 DIAGNOSIS — M5417 Radiculopathy, lumbosacral region: Secondary | ICD-10-CM | POA: Diagnosis not present

## 2022-11-07 DIAGNOSIS — M9903 Segmental and somatic dysfunction of lumbar region: Secondary | ICD-10-CM | POA: Diagnosis not present

## 2022-11-09 DIAGNOSIS — M5417 Radiculopathy, lumbosacral region: Secondary | ICD-10-CM | POA: Diagnosis not present

## 2022-11-09 DIAGNOSIS — M9903 Segmental and somatic dysfunction of lumbar region: Secondary | ICD-10-CM | POA: Diagnosis not present

## 2022-11-09 DIAGNOSIS — M9905 Segmental and somatic dysfunction of pelvic region: Secondary | ICD-10-CM | POA: Diagnosis not present

## 2022-11-09 DIAGNOSIS — M5416 Radiculopathy, lumbar region: Secondary | ICD-10-CM | POA: Diagnosis not present

## 2022-11-10 DIAGNOSIS — M5416 Radiculopathy, lumbar region: Secondary | ICD-10-CM | POA: Diagnosis not present

## 2022-11-10 DIAGNOSIS — M5417 Radiculopathy, lumbosacral region: Secondary | ICD-10-CM | POA: Diagnosis not present

## 2022-11-10 DIAGNOSIS — M9903 Segmental and somatic dysfunction of lumbar region: Secondary | ICD-10-CM | POA: Diagnosis not present

## 2022-11-10 DIAGNOSIS — M9905 Segmental and somatic dysfunction of pelvic region: Secondary | ICD-10-CM | POA: Diagnosis not present

## 2022-11-15 DIAGNOSIS — M5417 Radiculopathy, lumbosacral region: Secondary | ICD-10-CM | POA: Diagnosis not present

## 2022-11-15 DIAGNOSIS — M9905 Segmental and somatic dysfunction of pelvic region: Secondary | ICD-10-CM | POA: Diagnosis not present

## 2022-11-15 DIAGNOSIS — M9903 Segmental and somatic dysfunction of lumbar region: Secondary | ICD-10-CM | POA: Diagnosis not present

## 2022-11-15 DIAGNOSIS — M5416 Radiculopathy, lumbar region: Secondary | ICD-10-CM | POA: Diagnosis not present

## 2022-11-17 DIAGNOSIS — M5416 Radiculopathy, lumbar region: Secondary | ICD-10-CM | POA: Diagnosis not present

## 2022-11-17 DIAGNOSIS — M9903 Segmental and somatic dysfunction of lumbar region: Secondary | ICD-10-CM | POA: Diagnosis not present

## 2022-11-17 DIAGNOSIS — M5417 Radiculopathy, lumbosacral region: Secondary | ICD-10-CM | POA: Diagnosis not present

## 2022-11-17 DIAGNOSIS — M9905 Segmental and somatic dysfunction of pelvic region: Secondary | ICD-10-CM | POA: Diagnosis not present

## 2022-11-22 DIAGNOSIS — M5416 Radiculopathy, lumbar region: Secondary | ICD-10-CM | POA: Diagnosis not present

## 2022-11-22 DIAGNOSIS — M9905 Segmental and somatic dysfunction of pelvic region: Secondary | ICD-10-CM | POA: Diagnosis not present

## 2022-11-22 DIAGNOSIS — M5417 Radiculopathy, lumbosacral region: Secondary | ICD-10-CM | POA: Diagnosis not present

## 2022-11-22 DIAGNOSIS — M9903 Segmental and somatic dysfunction of lumbar region: Secondary | ICD-10-CM | POA: Diagnosis not present

## 2022-11-24 DIAGNOSIS — M9905 Segmental and somatic dysfunction of pelvic region: Secondary | ICD-10-CM | POA: Diagnosis not present

## 2022-11-24 DIAGNOSIS — M5417 Radiculopathy, lumbosacral region: Secondary | ICD-10-CM | POA: Diagnosis not present

## 2022-11-24 DIAGNOSIS — M5416 Radiculopathy, lumbar region: Secondary | ICD-10-CM | POA: Diagnosis not present

## 2022-11-24 DIAGNOSIS — M9903 Segmental and somatic dysfunction of lumbar region: Secondary | ICD-10-CM | POA: Diagnosis not present

## 2022-11-30 DIAGNOSIS — M9903 Segmental and somatic dysfunction of lumbar region: Secondary | ICD-10-CM | POA: Diagnosis not present

## 2022-11-30 DIAGNOSIS — M5417 Radiculopathy, lumbosacral region: Secondary | ICD-10-CM | POA: Diagnosis not present

## 2022-11-30 DIAGNOSIS — M9905 Segmental and somatic dysfunction of pelvic region: Secondary | ICD-10-CM | POA: Diagnosis not present

## 2022-11-30 DIAGNOSIS — M5416 Radiculopathy, lumbar region: Secondary | ICD-10-CM | POA: Diagnosis not present

## 2022-12-07 DIAGNOSIS — M5417 Radiculopathy, lumbosacral region: Secondary | ICD-10-CM | POA: Diagnosis not present

## 2022-12-07 DIAGNOSIS — M9905 Segmental and somatic dysfunction of pelvic region: Secondary | ICD-10-CM | POA: Diagnosis not present

## 2022-12-07 DIAGNOSIS — M5416 Radiculopathy, lumbar region: Secondary | ICD-10-CM | POA: Diagnosis not present

## 2022-12-07 DIAGNOSIS — M9903 Segmental and somatic dysfunction of lumbar region: Secondary | ICD-10-CM | POA: Diagnosis not present

## 2022-12-08 DIAGNOSIS — Z23 Encounter for immunization: Secondary | ICD-10-CM | POA: Diagnosis not present

## 2023-06-13 DIAGNOSIS — H43393 Other vitreous opacities, bilateral: Secondary | ICD-10-CM | POA: Diagnosis not present

## 2023-06-13 DIAGNOSIS — H26493 Other secondary cataract, bilateral: Secondary | ICD-10-CM | POA: Diagnosis not present

## 2023-06-13 DIAGNOSIS — H5203 Hypermetropia, bilateral: Secondary | ICD-10-CM | POA: Diagnosis not present

## 2023-06-27 DIAGNOSIS — M5417 Radiculopathy, lumbosacral region: Secondary | ICD-10-CM | POA: Diagnosis not present

## 2023-06-27 DIAGNOSIS — M9905 Segmental and somatic dysfunction of pelvic region: Secondary | ICD-10-CM | POA: Diagnosis not present

## 2023-06-27 DIAGNOSIS — M5416 Radiculopathy, lumbar region: Secondary | ICD-10-CM | POA: Diagnosis not present

## 2023-06-27 DIAGNOSIS — M9903 Segmental and somatic dysfunction of lumbar region: Secondary | ICD-10-CM | POA: Diagnosis not present

## 2023-06-29 DIAGNOSIS — M9903 Segmental and somatic dysfunction of lumbar region: Secondary | ICD-10-CM | POA: Diagnosis not present

## 2023-06-29 DIAGNOSIS — M5417 Radiculopathy, lumbosacral region: Secondary | ICD-10-CM | POA: Diagnosis not present

## 2023-06-29 DIAGNOSIS — M9905 Segmental and somatic dysfunction of pelvic region: Secondary | ICD-10-CM | POA: Diagnosis not present

## 2023-06-29 DIAGNOSIS — M5416 Radiculopathy, lumbar region: Secondary | ICD-10-CM | POA: Diagnosis not present

## 2023-07-03 DIAGNOSIS — M5417 Radiculopathy, lumbosacral region: Secondary | ICD-10-CM | POA: Diagnosis not present

## 2023-07-03 DIAGNOSIS — M5416 Radiculopathy, lumbar region: Secondary | ICD-10-CM | POA: Diagnosis not present

## 2023-07-03 DIAGNOSIS — M9903 Segmental and somatic dysfunction of lumbar region: Secondary | ICD-10-CM | POA: Diagnosis not present

## 2023-07-03 DIAGNOSIS — M9905 Segmental and somatic dysfunction of pelvic region: Secondary | ICD-10-CM | POA: Diagnosis not present

## 2023-07-05 ENCOUNTER — Ambulatory Visit: Payer: Self-pay

## 2023-07-05 DIAGNOSIS — Z0000 Encounter for general adult medical examination without abnormal findings: Secondary | ICD-10-CM

## 2023-07-05 LAB — POCT URINALYSIS DIPSTICK
Bilirubin, UA: NEGATIVE
Blood, UA: NEGATIVE
Glucose, UA: NEGATIVE
Ketones, UA: NEGATIVE
Leukocytes, UA: NEGATIVE
Nitrite, UA: NEGATIVE
Protein, UA: NEGATIVE
Spec Grav, UA: 1.025 (ref 1.010–1.025)
Urobilinogen, UA: 0.2 U/dL
pH, UA: 6 (ref 5.0–8.0)

## 2023-07-06 LAB — CMP12+LP+TP+TSH+6AC+PSA+CBC…
ALT: 23 IU/L (ref 0–44)
AST: 26 IU/L (ref 0–40)
Albumin: 4.2 g/dL (ref 3.9–4.9)
Alkaline Phosphatase: 182 IU/L — ABNORMAL HIGH (ref 44–121)
BUN/Creatinine Ratio: 16 (ref 10–24)
BUN: 13 mg/dL (ref 8–27)
Basophils Absolute: 0 10*3/uL (ref 0.0–0.2)
Basos: 0 %
Bilirubin Total: 0.9 mg/dL (ref 0.0–1.2)
Calcium: 9.7 mg/dL (ref 8.6–10.2)
Chloride: 102 mmol/L (ref 96–106)
Chol/HDL Ratio: 3.5 ratio (ref 0.0–5.0)
Cholesterol, Total: 156 mg/dL (ref 100–199)
Creatinine, Ser: 0.79 mg/dL (ref 0.76–1.27)
EOS (ABSOLUTE): 0.3 10*3/uL (ref 0.0–0.4)
Eos: 4 %
Estimated CHD Risk: 0.5 times avg. (ref 0.0–1.0)
Free Thyroxine Index: 11.4 — ABNORMAL HIGH (ref 1.2–4.9)
GGT: 36 IU/L (ref 0–65)
Globulin, Total: 2.6 g/dL (ref 1.5–4.5)
Glucose: 95 mg/dL (ref 70–99)
HDL: 45 mg/dL (ref >39)
Hematocrit: 43.6 % (ref 37.5–51.0)
Hemoglobin: 14.8 g/dL (ref 13.0–17.7)
Immature Grans (Abs): 0 10*3/uL (ref 0.0–0.1)
Immature Granulocytes: 0 %
Iron: 147 ug/dL (ref 38–169)
LDH: 153 IU/L (ref 121–224)
LDL Chol Calc (NIH): 99 mg/dL (ref 0–99)
Lymphocytes Absolute: 3.6 10*3/uL — ABNORMAL HIGH (ref 0.7–3.1)
Lymphs: 48 %
MCH: 29.1 pg (ref 26.6–33.0)
MCHC: 33.9 g/dL (ref 31.5–35.7)
MCV: 86 fL (ref 79–97)
Monocytes Absolute: 0.9 10*3/uL (ref 0.1–0.9)
Monocytes: 13 %
Neutrophils Absolute: 2.5 10*3/uL (ref 1.4–7.0)
Neutrophils: 35 %
Phosphorus: 3.5 mg/dL (ref 2.8–4.1)
Platelets: 270 10*3/uL (ref 150–450)
Potassium: 4 mmol/L (ref 3.5–5.2)
Prostate Specific Ag, Serum: 3.5 ng/mL (ref 0.0–4.0)
RBC: 5.09 x10E6/uL (ref 4.14–5.80)
RDW: 13.1 % (ref 11.6–15.4)
Sodium: 143 mmol/L (ref 134–144)
T3 Uptake Ratio: 48 % — ABNORMAL HIGH (ref 24–39)
T4, Total: 23.7 ug/dL (ref 4.5–12.0)
TSH: <0.005 u[IU]/mL — ABNORMAL LOW (ref 0.450–4.500)
Total Protein: 6.8 g/dL (ref 6.0–8.5)
Triglycerides: 58 mg/dL (ref 0–149)
Uric Acid: 4.9 mg/dL (ref 3.8–8.4)
VLDL Cholesterol Cal: 12 mg/dL (ref 5–40)
WBC: 7.4 10*3/uL (ref 3.4–10.8)
eGFR: 100 mL/min/{1.73_m2} (ref >59)

## 2023-07-10 DIAGNOSIS — M9903 Segmental and somatic dysfunction of lumbar region: Secondary | ICD-10-CM | POA: Diagnosis not present

## 2023-07-10 DIAGNOSIS — M5417 Radiculopathy, lumbosacral region: Secondary | ICD-10-CM | POA: Diagnosis not present

## 2023-07-10 DIAGNOSIS — M5416 Radiculopathy, lumbar region: Secondary | ICD-10-CM | POA: Diagnosis not present

## 2023-07-10 DIAGNOSIS — M9905 Segmental and somatic dysfunction of pelvic region: Secondary | ICD-10-CM | POA: Diagnosis not present

## 2023-07-12 ENCOUNTER — Ambulatory Visit: Payer: Self-pay | Admitting: Physician Assistant

## 2023-07-12 ENCOUNTER — Encounter: Payer: Self-pay | Admitting: Physician Assistant

## 2023-07-12 VITALS — BP 133/65 | HR 90 | Temp 97.3°F | Resp 16 | Wt 142.0 lb

## 2023-07-12 DIAGNOSIS — R748 Abnormal levels of other serum enzymes: Secondary | ICD-10-CM

## 2023-07-12 DIAGNOSIS — Z0000 Encounter for general adult medical examination without abnormal findings: Secondary | ICD-10-CM

## 2023-07-12 DIAGNOSIS — R7989 Other specified abnormal findings of blood chemistry: Secondary | ICD-10-CM

## 2023-07-12 NOTE — Progress Notes (Signed)
 Here for yearly physical with provider with [employer].  Only c/o sinuses this season and only using Flonase.  Reports multiple broken nose and congestion issues.  Discussed OTC meds.  Labs drawn as ordered.

## 2023-07-12 NOTE — Progress Notes (Signed)
 [facility]  ____________________________________________   None    (approximate)  I have reviewed the triage vital signs and the nursing notes.   HISTORY  Chief Complaint Annual Exam   HPI Patrick Benson is a 63 y.o. male patient presents for annual physical exam.  Patient complaining of 15 pounds unintentional weight loss, heart palpitations, and sweats.  No family history of hypothyroidism.  Patient also complaining of nasal congestion.         Past Medical History:  Diagnosis Date   Elevated PSA    Hyperthyroidism    Hypothyroidism     Patient Active Problem List   Diagnosis Date Noted   External hemorrhoids without complication 07/07/2022   Grade II hemorrhoids 07/07/2022   Insect bite (nonvenomous) of abdominal wall, initial encounter 11/15/2018   Dermatitis 11/15/2018   Elevated PSA 09/21/2015    Past Surgical History:  Procedure Laterality Date   broken nose     COLONOSCOPY WITH PROPOFOL  N/A 12/22/2014   Procedure: COLONOSCOPY WITH PROPOFOL ;  Surgeon: Cassie Click, MD;  Location: [facility] ENDOSCOPY;  Service: Endoscopy;  Laterality: N/A;   TONSILECTOMY, ADENOIDECTOMY, BILATERAL MYRINGOTOMY AND TUBES      Prior to Admission medications   Medication Sig Start Date End Date Taking? Authorizing Provider  fluticasone (FLONASE) 50 MCG/ACT nasal spray Place 1 spray into both nostrils daily.   Yes [provider]    Allergies Patient has no known allergies.  Family History  Problem Relation Age of Onset   Hematuria Neg Hx    Prostate cancer Neg Hx    Sickle cell anemia Neg Hx     Social History Social History   Tobacco Use   Smoking status: Never    Passive exposure: Never   Smokeless tobacco: Current    Types: Snuff  Vaping Use   Vaping status: Never Used  Substance Use Topics   Alcohol use: No   Drug use: No    Review of Systems  Constitutional: No fever/chills Eyes: No visual changes. ENT:  No sore throat. Cardiovascular: Denies chest pain. Respiratory: Denies shortness of breath. Gastrointestinal: No abdominal pain.  No nausea, no vomiting.  No diarrhea.  No constipation. Genitourinary: Negative for dysuria.  Elevated PSA Musculoskeletal: Negative for back pain. Skin: Negative for rash. Neurological: Negative for headaches, focal weakness or numbness.  Endocrine: Hyperthyroidism  ____________________________________________   PHYSICAL EXAM:  VITAL SIGNS: BP 133/65  Pulse Rate 90  Temp 97.3 F (36.3 C)Temp. 97.3 F (36.3 C). Data is abnormal. Taken on 07/12/23 3:06 PM  Weight 142 lb (64.4 kg)  Resp 16  SpO2 96 %   BMI: 25.15 kg/m2  BSA: 1.69 m2   Constitutional: Alert and oriented. Well appearing and in no acute distress. Eyes: Conjunctivae are normal. PERRL. EOMI. Head: Atraumatic. Nose: No congestion/rhinnorhea. Mouth/Throat: Mucous membranes are moist.  Oropharynx non-erythematous. Neck: No stridor.  No cervical spine tenderness to palpation. Hematological/Lymphatic/Immunilogical: No cervical lymphadenopathy. Cardiovascular: Normal rate, regular rhythm. Grossly normal heart sounds.  Good peripheral circulation. Respiratory: Normal respiratory effort.  No retractions. Lungs CTAB. Gastrointestinal: Soft and nontender. No distention. No abdominal bruits. No CVA tenderness. Genitourinary: Deferred Musculoskeletal: No lower extremity tenderness nor edema.  No joint effusions. Neurologic:  Normal speech and language. No gross focal neurologic deficits are appreciated. No gait instability. Skin:  Skin is warm, dry and intact. No rash noted. Psychiatric: Mood and affect are normal. Speech and behavior are normal.  ____________________________________________   [name]  Component Ref Range & Units (hover) 7 d ago (07/05/23) 1 yr ago (06/24/22) 1 yr ago (08/20/21) 3 yr ago (06/11/20) 4 yr ago (06/24/19)  Color, UA Amber yellow yellow dark yellow Dark Yellow   Clarity, UA Clear clear clear clear Clear  Glucose, UA Negative Negative Negative Negative Negative  Bilirubin, UA Negative neg negative negative Negative  Ketones, UA Negative neg negative negative Negative  Spec Grav, UA 1.025 1.025 1.025 >=1.030 Abnormal  >=1.030 Abnormal   Blood, UA Negative neg negative negative Negative  pH, UA 6.0 6.0 6.0 5.5 5.5  Protein, UA Negative Negative Negative Positive Abnormal  Negative  Urobilinogen, UA 0.2 0.2 0.2 0.2 0.2  Nitrite, UA Negative neg negative negative Negative  Leukocytes, UA Negative Negative Negative Negative Negative  Appearance  dark  medium   Odor             Specimen Collected: 07/05/23 09:27 Last Resulted: 07/05/23 09:27      Lab Flowsheet      Order Details      View Encounter      Lab and Collection Details      Routing      Result History    View All Conversations on this Encounter    Result Care Coordination   Patient Communication   Add Comments   Seen Back to Top   Other Results from 07/05/2023   Contains critical data CMP12+LP+TP+TSH+6AC+PSA+CBC. Order: [id number]  Status: Final result     Next appt: None     Dx: Routine adult health maintenance     Test Result Released: Yes (seen)   0 Result Notes          Component Ref Range & Units (hover) 7 d ago (07/05/23) 1 yr ago (06/24/22) 1 yr ago (03/07/22) 1 yr ago (08/20/21) 3 yr ago (06/11/20) 4 yr ago (06/24/19)  Glucose 95 83  92 94 R 96 R  Uric Acid 4.9 5.9 CM  4.2 CM 4.6 CM 4.9 CM  Comment:            Therapeutic target for gout patients: <6.0  BUN 13 14  12 13 11  R  Creatinine, Ser 0.79 1.19  1.03 1.10 0.96  eGFR 100 69  83 77   BUN/Creatinine Ratio 16 12  12 12 11  R  Sodium 143 141  141 143 141  Potassium 4.0 4.7  4.4 4.3 4.2  Chloride 102 103  103 104 105  Calcium 9.7 9.7  9.5 9.8 9.4 R  Phosphorus 3.5 3.2  2.9 3.7 3.9  Total Protein 6.8 7.0  7.2 7.5 7.2  Albumin 4.2 4.7  4.6 R 4.7 R 4.3 R  Globulin, Total 2.6 2.3  2.6 2.8 2.9   Bilirubin Total 0.9 0.5  0.3 0.6 0.3  Alkaline Phosphatase 182 High  85  88 79 88 R  LDH 153 163  171 163 178  AST 26 18  24 26 24   ALT 23 29  20 15 28   GGT 36 22  19 15  34  Iron 147 62  79 98 57  Cholesterol, Total 156 194  218 High  196 211 High   Triglycerides 58 77  75 42 98  HDL 45 47  49 58 51  VLDL Cholesterol Cal 12 14  13 8 18   LDL Chol Calc (NIH) 99 133 High   156 High  130 High  142 High   Chol/HDL Ratio 3.5 4.1 CM  4.4 CM 3.4 CM 4.1  CM  Comment:                                   T. Chol/HDL Ratio                                             Men  Women                               1/2 Avg.Risk  3.4    3.3                                   Avg.Risk  5.0    4.4                                2X Avg.Risk  9.6    7.1                                3X Avg.Risk 23.4   11.0  Estimated CHD Risk 0.5 0.8 CM  0.9 CM 0.5 CM 0.8 CM  Comment: The CHD Risk is based on the T. Chol/HDL ratio. Other factors affect CHD Risk such as hypertension, smoking, diabetes, severe obesity, and family history of premature CHD.  TSH <0.005 Low  3.180  1.510 0.939 1.170  T4, Total 23.7 High Panic  7.6  10.0 10.5 8.7  T3 Uptake Ratio 48 High  26  26 27 26   Free Thyroxine Index 11.4 High  2.0  2.6 2.8 2.3  Prostate Specific Ag, Serum 3.5 0.5 CM 2.1 CM 5.2 High  CM 1.7 CM 1.7 CM  Comment: Roche ECLIA methodology. According to the American U[employer] Association, Serum PSA should decrease and remain at undetectable levels after radical prostatectomy. The AUA defines biochemical recurrence as an initial PSA value 0.2 ng/mL or greater followed by a subsequent confirmatory PSA value 0.2 ng/mL or greater. Values obtained with different assay methods or kits cannot be used interchangeably. Results cannot be interpreted as absolute evidence of the presence or absence of malignant disease.  WBC 7.4 7.0  6.6 6.4 8.3  RBC 5.09 4.90  5.24 5.17 5.27  Hemoglobin 14.8 14.2  16.0 15.6 16.3  Hematocrit 43.6 42.3   47.0 46.3 46.3  MCV 86 86  90 90 88  MCH 29.1 29.0  30.5 30.2 30.9  MCHC 33.9 33.6  34.0 33.7 35.2  RDW 13.1 11.9  12.7 12.8 12.7  Platelets 270 273  290 291 302  Neutrophils 35 42  44 53 53  Lymphs 48 41  40 33 33  Monocytes 13 11  8 8 8   Eos 4 5  7 5 5   Basos 0 1  1 1 1   Neutrophils Absolute 2.5 2.9  2.9 3.3 4.5  Lymphocytes Absolute 3.6 High  2.9  2.6 2.1 2.8  Monocytes Absolute 0.9 0.8  0.5 0.5 0.6  EOS (ABSOLUTE) 0.3 0.3  0.5 High  0.3 0.4  Basophils Absolute 0.0 0.1  0.1 0.1 0.1  Immature Granulocytes 0 0  0 0 0  Immature Grans (Abs)  0.0 0.0  0.0 0.0 0.0            ____________________________________________  EKG  Sinus rhythm 91 bpm ____________________________________________    ____________________________________________   INITIAL IMPRESSION / ASSESSMENT AND PLAN   As part of my medical decision making, I reviewed the following data within the electronic MEDICAL RECORD NUMBER       No acute findings on physical exam or EKG.     ____________________________________________   FINAL CLINICAL IMPRESSION Exam suspicious for hyperhyroidism   ED Discharge Orders          Ordered    Alkaline phosphatase        07/12/23 1517    Thyroid  Profile        07/12/23 1517             Note:  This document was prepared using Dragon voice recognition software and may include unintentional dictation errors.

## 2023-07-13 LAB — THYROID PANEL
Free Thyroxine Index: 11.1 — ABNORMAL HIGH (ref 1.2–4.9)
T3 Uptake Ratio: 48 % — ABNORMAL HIGH (ref 24–39)
T4, Total: 23.2 ug/dL (ref 4.5–12.0)

## 2023-07-13 LAB — ALKALINE PHOSPHATASE: Alkaline Phosphatase: 204 IU/L — ABNORMAL HIGH (ref 44–121)

## 2023-07-13 NOTE — Addendum Note (Signed)
 Addended by: Walt Gunner on: 07/13/2023 02:18 PM   Modules accepted: Orders

## 2023-07-17 ENCOUNTER — Encounter: Payer: Self-pay | Admitting: Physician Assistant

## 2023-07-17 ENCOUNTER — Ambulatory Visit: Payer: Self-pay | Admitting: Physician Assistant

## 2023-07-17 VITALS — BP 133/67 | HR 98 | Resp 14 | Ht 63.0 in | Wt 138.0 lb

## 2023-07-17 DIAGNOSIS — E0590 Thyrotoxicosis, unspecified without thyrotoxic crisis or storm: Secondary | ICD-10-CM | POA: Insufficient documentation

## 2023-07-17 DIAGNOSIS — E039 Hypothyroidism, unspecified: Secondary | ICD-10-CM

## 2023-07-17 NOTE — Progress Notes (Signed)
   Subjective: Hypothyroidism    Patient ID: Patrick Benson, male    DOB: [birth year], 63 y.o.   MRN: [id number]  HPI Patient is follow-up from first annual physical exam on 07/12/2023 at this clinic.  Patient was concerned because  unintentional weight loss of approximately 15 pounds.  Patient thyroid  function profile was elevated.  Patient's labs were repeated and confirmed elevated thyroid  profile..  Patient states he remember his father had a thyroid  started on him a few years ago.  Denies any of the signs and symptoms of hypothyroidism.   Review of Systems Negative except for above complaint    Objective:   Physical Exam           Component Ref Range & Units (hover) 5 d ago (07/12/23) 12 d ago (07/05/23) 1 yr ago (06/24/22) 1 yr ago (08/20/21) 3 yr ago (06/11/20) 4 yr ago (06/24/19)  T4, Total 23.2 High Panic  23.7 High Panic  7.6 10.0 10.5 8.7  T3 Uptake Ratio 48 High  48 High  26 26 27 26   Free Thyroxine Index 11.1 High  11.4 High  2.0 2.6 2.8 2.3            Physical exam was deferred.       Assessment & Plan: Hyperthyroidism  Consult to endocrine for definitive evaluation and treatment.

## 2023-07-17 NOTE — Progress Notes (Signed)
Pt presents today to go over lab results.

## 2023-07-25 DIAGNOSIS — M5417 Radiculopathy, lumbosacral region: Secondary | ICD-10-CM | POA: Diagnosis not present

## 2023-07-25 DIAGNOSIS — M9903 Segmental and somatic dysfunction of lumbar region: Secondary | ICD-10-CM | POA: Diagnosis not present

## 2023-07-25 DIAGNOSIS — M9905 Segmental and somatic dysfunction of pelvic region: Secondary | ICD-10-CM | POA: Diagnosis not present

## 2023-07-25 DIAGNOSIS — M5416 Radiculopathy, lumbar region: Secondary | ICD-10-CM | POA: Diagnosis not present

## 2023-09-04 DIAGNOSIS — E0590 Thyrotoxicosis, unspecified without thyrotoxic crisis or storm: Secondary | ICD-10-CM | POA: Diagnosis not present

## 2023-10-12 DIAGNOSIS — L814 Other melanin hyperpigmentation: Secondary | ICD-10-CM | POA: Diagnosis not present

## 2023-10-12 DIAGNOSIS — D225 Melanocytic nevi of trunk: Secondary | ICD-10-CM | POA: Diagnosis not present

## 2023-10-12 DIAGNOSIS — L821 Other seborrheic keratosis: Secondary | ICD-10-CM | POA: Diagnosis not present

## 2023-10-12 DIAGNOSIS — D1801 Hemangioma of skin and subcutaneous tissue: Secondary | ICD-10-CM | POA: Diagnosis not present

## 2023-10-31 DIAGNOSIS — E0590 Thyrotoxicosis, unspecified without thyrotoxic crisis or storm: Secondary | ICD-10-CM | POA: Diagnosis not present

## 2023-11-06 DIAGNOSIS — E0500 Thyrotoxicosis with diffuse goiter without thyrotoxic crisis or storm: Secondary | ICD-10-CM | POA: Diagnosis not present

## 2024-01-02 DIAGNOSIS — E0500 Thyrotoxicosis with diffuse goiter without thyrotoxic crisis or storm: Secondary | ICD-10-CM | POA: Diagnosis not present

## 2024-01-09 DIAGNOSIS — E0500 Thyrotoxicosis with diffuse goiter without thyrotoxic crisis or storm: Secondary | ICD-10-CM | POA: Diagnosis not present

## 2024-02-08 DIAGNOSIS — D485 Neoplasm of uncertain behavior of skin: Secondary | ICD-10-CM | POA: Diagnosis not present
